# Patient Record
Sex: Female | Born: 1995 | Race: White | Hispanic: No | Marital: Single | Smoking: Never smoker
Health system: Southern US, Community
[De-identification: ages and names within clinical notes are randomized; demographics above are authoritative.]

## PROBLEM LIST (undated history)

## (undated) ENCOUNTER — Inpatient Hospital Stay (HOSPITAL_COMMUNITY): Payer: Self-pay

## (undated) DIAGNOSIS — R51 Headache: Secondary | ICD-10-CM

## (undated) DIAGNOSIS — F329 Major depressive disorder, single episode, unspecified: Secondary | ICD-10-CM

## (undated) DIAGNOSIS — F419 Anxiety disorder, unspecified: Secondary | ICD-10-CM

## (undated) DIAGNOSIS — R519 Headache, unspecified: Secondary | ICD-10-CM

## (undated) DIAGNOSIS — F32A Depression, unspecified: Secondary | ICD-10-CM

## (undated) DIAGNOSIS — D649 Anemia, unspecified: Secondary | ICD-10-CM

---
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
## 2014-04-08 ENCOUNTER — Encounter (HOSPITAL_COMMUNITY): Payer: Self-pay | Admitting: *Deleted

## 2014-04-08 ENCOUNTER — Inpatient Hospital Stay (HOSPITAL_COMMUNITY): Payer: Medicaid Other

## 2014-04-08 ENCOUNTER — Inpatient Hospital Stay (HOSPITAL_COMMUNITY)
Admission: AD | Admit: 2014-04-08 | Discharge: 2014-04-08 | Disposition: A | Discharge status: Home / Self Care | Payer: Medicaid Other | Source: Ambulatory Visit | Attending: Obstetrics & Gynecology | Admitting: Obstetrics & Gynecology

## 2014-04-08 DIAGNOSIS — O418X10 Other specified disorders of amniotic fluid and membranes, first trimester, not applicable or unspecified: Secondary | ICD-10-CM

## 2014-04-08 DIAGNOSIS — Z3A01 Less than 8 weeks gestation of pregnancy: Secondary | ICD-10-CM | POA: Diagnosis not present

## 2014-04-08 DIAGNOSIS — O200 Threatened abortion: Secondary | ICD-10-CM | POA: Diagnosis not present

## 2014-04-08 DIAGNOSIS — O209 Hemorrhage in early pregnancy, unspecified: Secondary | ICD-10-CM | POA: Insufficient documentation

## 2014-04-08 DIAGNOSIS — O468X1 Other antepartum hemorrhage, first trimester: Secondary | ICD-10-CM

## 2014-04-08 DIAGNOSIS — B373 Candidiasis of vulva and vagina: Secondary | ICD-10-CM | POA: Diagnosis not present

## 2014-04-08 DIAGNOSIS — O98811 Other maternal infectious and parasitic diseases complicating pregnancy, first trimester: Secondary | ICD-10-CM | POA: Insufficient documentation

## 2014-04-08 HISTORY — DX: Anemia, unspecified: D64.9

## 2014-04-08 HISTORY — DX: Headache: R51

## 2014-04-08 HISTORY — DX: Headache, unspecified: R51.9

## 2014-04-08 LAB — CBC
HCT: 39.7 % (ref 36.0–46.0)
Hemoglobin: 13.6 g/dL (ref 12.0–15.0)
MCH: 30.4 pg (ref 26.0–34.0)
MCHC: 34.3 g/dL (ref 30.0–36.0)
MCV: 88.8 fL (ref 78.0–100.0)
Platelets: 251 10*3/uL (ref 150–400)
RBC: 4.47 MIL/uL (ref 3.87–5.11)
RDW: 12.9 % (ref 11.5–15.5)
WBC: 12.2 10*3/uL — ABNORMAL HIGH (ref 4.0–10.5)

## 2014-04-08 LAB — WET PREP, GENITAL
CLUE CELLS WET PREP: NONE SEEN
Trich, Wet Prep: NONE SEEN

## 2014-04-08 LAB — URINE MICROSCOPIC-ADD ON

## 2014-04-08 LAB — URINALYSIS, ROUTINE W REFLEX MICROSCOPIC
BILIRUBIN URINE: NEGATIVE
Glucose, UA: NEGATIVE mg/dL
Ketones, ur: NEGATIVE mg/dL
NITRITE: NEGATIVE
PH: 6 (ref 5.0–8.0)
PROTEIN: NEGATIVE mg/dL
Specific Gravity, Urine: 1.025 (ref 1.005–1.030)
UROBILINOGEN UA: 0.2 mg/dL (ref 0.0–1.0)

## 2014-04-08 LAB — POCT PREGNANCY, URINE: PREG TEST UR: POSITIVE — AB

## 2014-04-08 LAB — HCG, QUANTITATIVE, PREGNANCY: hCG, Beta Chain, Quant, S: 87634 m[IU]/mL — ABNORMAL HIGH (ref <5)

## 2014-04-08 MED ORDER — FLUCONAZOLE 150 MG PO TABS
150.0000 mg | ORAL_TABLET | Freq: Once | ORAL | Status: AC
  Administered 2014-04-08: 150 mg via ORAL
  Filled 2014-04-08: qty 1

## 2014-04-08 NOTE — MAU Provider Note (Signed)
History     CSN: [id number]  Arrival date and time: 04/08/14 1728   First Provider Initiated Contact with Patient 04/08/14 1828      Chief Complaint  Patient presents with  . Vaginal Bleeding   HPI  Ms [name] is a 19 y.o. female G1P0 at Unknown gestation who presents with vaginal bleeding that has been going on for more than 24 hours. She also complains of lower abdominal pain that is worse on the left side.    OB History    Gravida Para Term Preterm AB TAB SAB Ectopic Multiple Living   1               Past Medical History  Diagnosis Date  . Headache   . Anemia     History reviewed. No pertinent past surgical history.  No family history on file.  History  Substance Use Topics  . Smoking status: Never Smoker   . Smokeless tobacco: Not on file  . Alcohol Use: No    Allergies: No Known Allergies  Prescriptions prior to admission  Medication Sig Dispense Refill Last Dose  . acetaminophen (TYLENOL) 325 MG tablet Take 650 mg by mouth every 6 (six) hours as needed for headache.   Past Week at Unknown time  . Prenatal Vit-Fe Fumarate-FA (PRENATAL MULTIVITAMIN) TABS tablet Take 1 tablet by mouth daily at 12 noon.   04/08/2014 at Unknown time   Results for orders placed or performed during the hospital encounter of 04/08/14 (from the past 48 hour(s))  Urinalysis, Routine w reflex microscopic     Status: Abnormal   Collection Time: 04/08/14  5:40 PM  Result Value Ref Range   Color, Urine YELLOW YELLOW   APPearance CLEAR CLEAR   Specific Gravity, Urine 1.025 1.005 - 1.030   pH 6.0 5.0 - 8.0   Glucose, UA NEGATIVE NEGATIVE mg/dL   Hgb urine dipstick LARGE (A) NEGATIVE   Bilirubin Urine NEGATIVE NEGATIVE   Ketones, ur NEGATIVE NEGATIVE mg/dL   Protein, ur NEGATIVE NEGATIVE mg/dL   Urobilinogen, UA 0.2 0.0 - 1.0 mg/dL   Nitrite NEGATIVE NEGATIVE   Leukocytes, UA SMALL (A) NEGATIVE  Urine microscopic-add on     Status: Abnormal   Collection Time: 04/08/14   5:40 PM  Result Value Ref Range   Squamous Epithelial / LPF FEW (A) RARE   WBC, UA 0-2 <3 WBC/hpf   RBC / HPF 3-6 <3 RBC/hpf   Bacteria, UA RARE RARE   Urine-Other YEAST   Pregnancy, urine POC     Status: Abnormal   Collection Time: 04/08/14  5:58 PM  Result Value Ref Range   Preg Test, Ur POSITIVE (A) NEGATIVE    Comment:        THE SENSITIVITY OF THIS METHODOLOGY IS >24 mIU/mL   Wet prep, genital     Status: Abnormal   Collection Time: 04/08/14  6:30 PM  Result Value Ref Range   Yeast Wet Prep HPF POC FEW (A) NONE SEEN   Trich, Wet Prep NONE SEEN NONE SEEN   Clue Cells Wet Prep HPF POC NONE SEEN NONE SEEN   WBC, Wet Prep HPF POC FEW (A) NONE SEEN    Comment: FEW BACTERIA SEEN  CBC     Status: Abnormal   Collection Time: 04/08/14  6:35 PM  Result Value Ref Range   WBC 12.2 (H) 4.0 - 10.5 K/uL   RBC 4.47 3.87 - 5.11 MIL/uL   Hemoglobin 13.6 12.0 - 15.0  g/dL   HCT 96.0 45.4 - 09.8 %   MCV 88.8 78.0 - 100.0 fL   MCH 30.4 26.0 - 34.0 pg   MCHC 34.3 30.0 - 36.0 g/dL   RDW 11.9 14.7 - 82.9 %   Platelets 251 150 - 400 K/uL  ABO/Rh     Status: None (Preliminary result)   Collection Time: 04/08/14  6:35 PM  Result Value Ref Range   ABO/RH(D) O POS   hCG, quantitative, pregnancy     Status: Abnormal   Collection Time: 04/08/14  6:35 PM  Result Value Ref Range   hCG, Beta Chain, Quant, S 56213 (H) <5 mIU/mL    Comment:          GEST. AGE      CONC.  (mIU/mL)   <=1 WEEK        5 - 50     2 WEEKS       50 - 500     3 WEEKS       100 - 10,000     4 WEEKS     1,000 - 30,000     5 WEEKS     3,500 - 115,000   6-8 WEEKS     12,000 - 270,000    12 WEEKS     15,000 - 220,000        FEMALE AND NON-PREGNANT FEMALE:     LESS THAN 5 mIU/mL     Review of Systems  Constitutional: Negative for fever and chills.  Gastrointestinal: Positive for abdominal pain (Bilateral lower abdominal pain worse on the left side. ). Negative for nausea and vomiting.  Genitourinary: Negative for  dysuria, urgency and frequency.       Vaginal bleeding; bright red    Physical Exam   Blood pressure 129/75, pulse 119, temperature 98.5 F (36.9 C), resp. rate 18, height  (1.626 m), weight 53.978 kg (119 lb), last menstrual period 02/03/2014.  Physical Exam  Constitutional: She is oriented to person, place, and time. She appears well-developed and well-nourished. No distress.  HENT:  Head: Normocephalic.  Eyes: Pupils are equal, round, and reactive to light.  Neck: Neck supple.  Respiratory: Effort normal and breath sounds normal. No respiratory distress.  GI: Soft. She exhibits no distension. There is no tenderness. There is no rebound.  Genitourinary:  Speculum deferred Wet prep and GC collected without speculum by NP. Brown, vaginal bleeding noted on swabs.  Bimanual exam: Cervix closed Uterus non tender, normal size Adnexa non tender, no masses bilaterally GC/Chlam, wet prep done Chaperone present for exam.  Musculoskeletal: Normal range of motion.  Neurological: She is alert and oriented to person, place, and time.  Skin: Skin is warm. She is not diaphoretic.  Psychiatric: Her behavior is normal.    MAU Course  Procedures None   MDM + yeast on UA; diflucan ordered   CBC ABO CBC Quant  [place] Wet pep GC HIV   Report given to K. Teague-Clark PAC who resumes care of the patient; patient in [place].   O positive blood type   Assessment and Plan   A:  Yeast vaginitis Vaginal bleeding in pregnancy; first trimester   Duane Lope, NP 04/08/2014 6:38 PM  P: Discharge to home Diflucan for yeast given in MAU.  Encourage good hydration Pelvic Rest Follow up for [facility] asap Patient may return to MAU as needed or if her condition were to change or worsen

## 2014-04-08 NOTE — MAU Note (Signed)
Pt presents to MAU with complaints of vaginal bleeding that started 2 hours ago. Reports pain in her left side.

## 2014-04-08 NOTE — Discharge Instructions (Signed)
Subchorionic Hematoma A subchorionic hematoma is a gathering of blood between the outer wall of the placenta and the inner wall of the womb (uterus). The placenta is the organ that connects the fetus to the wall of the uterus. The placenta performs the feeding, breathing (oxygen to the fetus), and waste removal (excretory work) of the fetus.  Subchorionic hematoma is the most common abnormality found on a result from ultrasonography done during the first trimester or early second trimester of pregnancy. If there has been little or no vaginal bleeding, early small hematomas usually shrink on their own and do not affect your baby or pregnancy. The blood is gradually absorbed over 1-2 weeks. When bleeding starts later in pregnancy or the hematoma is larger or occurs in an older pregnant woman, the outcome may not be as good. Larger hematomas may get bigger, which increases the chances for miscarriage. Subchorionic hematoma also increases the risk of premature detachment of the placenta from the uterus, preterm (premature) labor, and stillbirth. HOME CARE INSTRUCTIONS  Stay on bed rest if your health care provider recommends this. Although bed rest will not prevent more bleeding or prevent a miscarriage, your health care provider may recommend bed rest until you are advised otherwise.  Avoid heavy lifting (more than 10 lb [4.5 kg]), exercise, sexual intercourse, or douching as directed by your health care provider.  Keep track of the number of pads you use each day and how soaked (saturated) they are. Write down this information.  Do not use tampons.  Keep all follow-up appointments as directed by your health care provider. Your health care provider may ask you to have follow-up blood tests or ultrasound tests or both. SEEK IMMEDIATE MEDICAL CARE IF:  You have severe cramps in your stomach, back, abdomen, or pelvis.  You have a fever.  You pass large clots or tissue. Save any tissue for your health  care provider to look at.  Your bleeding increases or you become lightheaded, feel weak, or have fainting episodes. Document Released: 04/23/2006 Document Revised: 05/23/2013 Document Reviewed: 08/05/2012 [facility] Patient Information 2015 [place], [place]. This information is not intended to replace advice given to you by your health care provider. Make sure you discuss any questions you have with your health care provider.  Threatened Miscarriage A threatened miscarriage occurs when you have vaginal bleeding during your first 20 weeks of pregnancy but the pregnancy has not ended. If you have vaginal bleeding during this time, your health care provider will do tests to make sure you are still pregnant. If the tests show you are still pregnant and the developing baby (fetus) inside your womb (uterus) is still growing, your condition is considered a threatened miscarriage. A threatened miscarriage does not mean your pregnancy will end, but it does increase the risk of losing your pregnancy (complete miscarriage). CAUSES  The cause of a threatened miscarriage is usually not known. If you go on to have a complete miscarriage, the most common cause is an abnormal number of chromosomes in the developing baby. Chromosomes are the structures inside cells that hold all your genetic material. Some causes of vaginal bleeding that do not result in miscarriage include:  Having sex.  Having an infection.  Normal hormone changes of pregnancy.  Bleeding that occurs when an egg implants in your uterus. RISK FACTORS Risk factors for bleeding in early pregnancy include:  Obesity.  Smoking.  Drinking excessive amounts of alcohol or caffeine.  Recreational drug use. SIGNS AND SYMPTOMS  Light vaginal  bleeding.  Mild abdominal pain or cramps. DIAGNOSIS  If you have bleeding with or without abdominal pain before 20 weeks of pregnancy, your health care provider will do tests to check whether you are still  pregnant. One important test involves using sound waves and a computer (ultrasound) to create images of the inside of your uterus. Other tests include an internal exam of your vagina and uterus (pelvic exam) and measurement of your baby's heart rate.  You may be diagnosed with a threatened miscarriage if:  Ultrasound testing shows you are still pregnant.  Your baby's heart rate is strong.  A pelvic exam shows that the opening between your uterus and your vagina (cervix) is closed.  Your heart rate and blood pressure are stable.  Blood tests confirm you are still pregnant. TREATMENT  No treatments have been shown to prevent a threatened miscarriage from going on to a complete miscarriage. However, the right home care is important.  HOME CARE INSTRUCTIONS   Make sure you keep all your appointments for prenatal care. This is very important.  Get plenty of rest.  Do not have sex or use tampons if you have vaginal bleeding.  Do not douche.  Do not smoke or use recreational drugs.  Do not drink alcohol.  Avoid caffeine. SEEK MEDICAL CARE IF:  You have light vaginal bleeding or spotting while pregnant.  You have abdominal pain or cramping.  You have a fever. SEEK IMMEDIATE MEDICAL CARE IF:  You have heavy vaginal bleeding.  You have blood clots coming from your vagina.  You have severe low back pain or abdominal cramps.  You have fever, chills, and severe abdominal pain. MAKE SURE YOU:  Understand these instructions.  Will watch your condition.  Will get help right away if you are not doing well or get worse. Document Released: 01/06/2005 Document Revised: 01/11/2013 Document Reviewed: 11/02/2012 [facility] Patient Information 2015 [place]. This information is not intended to replace advice given to you by your health care provider. Make sure you discuss any questions you have with your health care provider.  Pelvic Rest Pelvic rest is sometimes recommended for  women when:   The placenta is partially or completely covering the opening of the cervix (placenta previa).  There is bleeding between the uterine wall and the amniotic sac in the first trimester (subchorionic hemorrhage).  The cervix begins to open without labor starting (incompetent cervix, cervical insufficiency).  The labor is too early (preterm labor). HOME CARE INSTRUCTIONS  Do not have sexual intercourse, stimulation, or an orgasm.  Do not use tampons, douche, or put anything in the vagina.  Do not lift anything over 10 pounds (4.5 kg).  Avoid strenuous activity or straining your pelvic muscles. SEEK MEDICAL CARE IF:  You have any vaginal bleeding during pregnancy. Treat this as a potential emergency.  You have cramping pain felt low in the stomach (stronger than menstrual cramps).  You notice vaginal discharge (watery, mucus, or bloody).  You have a low, dull backache.  There are regular contractions or uterine tightening. SEEK IMMEDIATE MEDICAL CARE IF: You have vaginal bleeding and have placenta previa.  Document Released: 05/03/2010 Document Revised: 03/31/2011 Document Reviewed: 05/03/2010 [facility] Patient Information 2015 [place]. This information is not intended to replace advice given to you by your health care provider. Make sure you discuss any questions you have with your health care provider.

## 2014-04-09 LAB — HIV ANTIBODY (ROUTINE TESTING W REFLEX): HIV Screen 4th Generation wRfx: NONREACTIVE

## 2014-04-09 LAB — ABO/RH: ABO/RH(D): O POS

## 2014-04-10 LAB — OB RESULTS CONSOLE HEPATITIS B SURFACE ANTIGEN: Hepatitis B Surface Ag: NEGATIVE

## 2014-04-10 LAB — GC/CHLAMYDIA PROBE AMP (~~LOC~~) NOT AT ARMC
CHLAMYDIA, DNA PROBE: NEGATIVE
Neisseria Gonorrhea: NEGATIVE

## 2014-04-10 LAB — OB RESULTS CONSOLE RPR: RPR: NONREACTIVE

## 2014-04-10 LAB — OB RESULTS CONSOLE RUBELLA ANTIBODY, IGM: Rubella: IMMUNE

## 2014-04-10 LAB — OB RESULTS CONSOLE ABO/RH: RH Type: POSITIVE

## 2014-04-10 LAB — OB RESULTS CONSOLE GC/CHLAMYDIA
CHLAMYDIA, DNA PROBE: NEGATIVE
GC PROBE AMP, GENITAL: NEGATIVE

## 2014-04-10 LAB — OB RESULTS CONSOLE ANTIBODY SCREEN: ANTIBODY SCREEN: NEGATIVE

## 2014-04-10 LAB — OB RESULTS CONSOLE HIV ANTIBODY (ROUTINE TESTING): HIV: NONREACTIVE

## 2014-05-10 ENCOUNTER — Encounter (HOSPITAL_COMMUNITY): Payer: Self-pay

## 2014-05-10 ENCOUNTER — Inpatient Hospital Stay (HOSPITAL_COMMUNITY)
Admission: AD | Admit: 2014-05-10 | Discharge: 2014-05-10 | Disposition: A | Discharge status: Home / Self Care | Payer: Medicaid Other | Source: Ambulatory Visit | Attending: Obstetrics and Gynecology | Admitting: Obstetrics and Gynecology

## 2014-05-10 DIAGNOSIS — G43009 Migraine without aura, not intractable, without status migrainosus: Secondary | ICD-10-CM | POA: Diagnosis not present

## 2014-05-10 DIAGNOSIS — O9989 Other specified diseases and conditions complicating pregnancy, childbirth and the puerperium: Secondary | ICD-10-CM | POA: Diagnosis not present

## 2014-05-10 DIAGNOSIS — Z3A11 11 weeks gestation of pregnancy: Secondary | ICD-10-CM | POA: Diagnosis not present

## 2014-05-10 DIAGNOSIS — R509 Fever, unspecified: Secondary | ICD-10-CM | POA: Diagnosis present

## 2014-05-10 LAB — COMPREHENSIVE METABOLIC PANEL
ALBUMIN: 3.4 g/dL — AB (ref 3.5–5.2)
ALK PHOS: 52 U/L (ref 39–117)
ALT: 22 U/L (ref 0–35)
AST: 34 U/L (ref 0–37)
Anion gap: 7 (ref 5–15)
BILIRUBIN TOTAL: 0.3 mg/dL (ref 0.3–1.2)
BUN: 9 mg/dL (ref 6–23)
CO2: 23 mmol/L (ref 19–32)
Calcium: 8.1 mg/dL — ABNORMAL LOW (ref 8.4–10.5)
Chloride: 101 mmol/L (ref 96–112)
Creatinine, Ser: 0.56 mg/dL (ref 0.50–1.10)
GFR calc Af Amer: >90 mL/min (ref >90)
GFR calc non Af Amer: >90 mL/min (ref >90)
GLUCOSE: 86 mg/dL (ref 70–99)
POTASSIUM: 3.6 mmol/L (ref 3.5–5.1)
Sodium: 131 mmol/L — ABNORMAL LOW (ref 135–145)
Total Protein: 6.7 g/dL (ref 6.0–8.3)

## 2014-05-10 LAB — CBC WITH DIFFERENTIAL/PLATELET
BASOS ABS: 0 10*3/uL (ref 0.0–0.1)
Basophils Relative: 0 % (ref 0–1)
EOS PCT: 0 % (ref 0–5)
Eosinophils Absolute: 0 10*3/uL (ref 0.0–0.7)
HCT: 36 % (ref 36.0–46.0)
Hemoglobin: 12.3 g/dL (ref 12.0–15.0)
LYMPHS PCT: 8 % — AB (ref 12–46)
Lymphs Abs: 0.4 10*3/uL — ABNORMAL LOW (ref 0.7–4.0)
MCH: 30.2 pg (ref 26.0–34.0)
MCHC: 34.2 g/dL (ref 30.0–36.0)
MCV: 88.5 fL (ref 78.0–100.0)
Monocytes Absolute: 0.7 10*3/uL (ref 0.1–1.0)
Monocytes Relative: 14 % — ABNORMAL HIGH (ref 3–12)
NEUTROS ABS: 3.9 10*3/uL (ref 1.7–7.7)
Neutrophils Relative %: 78 % — ABNORMAL HIGH (ref 43–77)
PLATELETS: 121 10*3/uL — AB (ref 150–400)
RBC: 4.07 MIL/uL (ref 3.87–5.11)
RDW: 13.6 % (ref 11.5–15.5)
WBC: 5 10*3/uL (ref 4.0–10.5)

## 2014-05-10 LAB — URINALYSIS, ROUTINE W REFLEX MICROSCOPIC
Bilirubin Urine: NEGATIVE
GLUCOSE, UA: NEGATIVE mg/dL
Hgb urine dipstick: NEGATIVE
Ketones, ur: 15 mg/dL — AB
LEUKOCYTES UA: NEGATIVE
NITRITE: NEGATIVE
PH: 5.5 (ref 5.0–8.0)
PROTEIN: NEGATIVE mg/dL
Specific Gravity, Urine: >1.03 — ABNORMAL HIGH (ref 1.005–1.030)
Urobilinogen, UA: 0.2 mg/dL (ref 0.0–1.0)

## 2014-05-10 MED ORDER — DEXAMETHASONE SODIUM PHOSPHATE 10 MG/ML IJ SOLN
10.0000 mg | Freq: Once | INTRAMUSCULAR | Status: AC
  Administered 2014-05-10: 10 mg via INTRAVENOUS
  Filled 2014-05-10: qty 1

## 2014-05-10 MED ORDER — METOCLOPRAMIDE HCL 5 MG/ML IJ SOLN
10.0000 mg | Freq: Once | INTRAMUSCULAR | Status: AC
  Administered 2014-05-10: 10 mg via INTRAVENOUS
  Filled 2014-05-10: qty 2

## 2014-05-10 MED ORDER — DIPHENHYDRAMINE HCL 50 MG/ML IJ SOLN
12.5000 mg | Freq: Once | INTRAMUSCULAR | Status: AC
  Administered 2014-05-10: 12.5 mg via INTRAVENOUS
  Filled 2014-05-10: qty 1

## 2014-05-10 MED ORDER — LACTATED RINGERS IV BOLUS (SEPSIS)
1000.0000 mL | Freq: Once | INTRAVENOUS | Status: AC
  Administered 2014-05-10: 1000 mL via INTRAVENOUS

## 2014-05-10 NOTE — Discharge Instructions (Signed)

## 2014-05-10 NOTE — MAU Note (Signed)
Patient c/o waking up this morning with fever of 103, she has heard this can cause miscarriages. Has a non productive cough.

## 2014-05-10 NOTE — MAU Provider Note (Signed)
History     CSN: [id number]  Arrival date and time: 05/10/14 1058   First Provider Initiated Contact with Patient 05/10/14 1220      Chief Complaint  Patient presents with  . Fever  . Cough   HPI   [name] is a 19 y.o. female G1P0 at 82w4dwho presents with fever. She checked her temperature at 0700 and it was 103 orally. She checked her temperature because she was feeling bad; chills, ha, and cough. She has had these symptoms since yesterday.     She did not take any medication for the fever and she denies cough currently.   The headache started Monday; she has a history of migraines. She took tylenol yesterday 650 mg, without relief. The headache comes and goes. She has never tried any prescription medications other than tylenol. + light sensitivity. Negative nausea currently. She rates her HA pain 10/10.   OB History    Gravida Para Term Preterm AB TAB SAB Ectopic Multiple Living   1               Past Medical History  Diagnosis Date  . Headache   . Anemia     History reviewed. No pertinent past surgical history.  History reviewed. No pertinent family history.  History  Substance Use Topics  . Smoking status: Never Smoker   . Smokeless tobacco: Not on file  . Alcohol Use: No    Allergies: No Known Allergies  Prescriptions prior to admission  Medication Sig Dispense Refill Last Dose  . acetaminophen (TYLENOL) 325 MG tablet Take 650 mg by mouth every 6 (six) hours as needed for headache.   05/09/2014 at Unknown time  . Prenatal Vit-Fe Fumarate-FA (PRENATAL MULTIVITAMIN) TABS tablet Take 1 tablet by mouth daily at 12 noon.   05/09/2014 at Unknown time   Results for orders placed or performed during the hospital encounter of 05/10/14 (from the past 48 hour(s))  Urinalysis, Routine w reflex microscopic     Status: Abnormal   Collection Time: 05/10/14 11:05 AM  Result Value Ref Range   Color, Urine YELLOW YELLOW   APPearance CLEAR CLEAR   Specific  Gravity, Urine >1.030 (H) 1.005 - 1.030   pH 5.5 5.0 - 8.0   Glucose, UA NEGATIVE NEGATIVE mg/dL   Hgb urine dipstick NEGATIVE NEGATIVE   Bilirubin Urine NEGATIVE NEGATIVE   Ketones, ur 15 (A) NEGATIVE mg/dL   Protein, ur NEGATIVE NEGATIVE mg/dL   Urobilinogen, UA 0.2 0.0 - 1.0 mg/dL   Nitrite NEGATIVE NEGATIVE   Leukocytes, UA NEGATIVE NEGATIVE    Comment: MICROSCOPIC NOT DONE ON URINES WITH NEGATIVE PROTEIN, BLOOD, LEUKOCYTES, NITRITE, OR GLUCOSE <1000 mg/dL.  CBC with Differential     Status: Abnormal   Collection Time: 05/10/14 11:55 AM  Result Value Ref Range   WBC 5.0 4.0 - 10.5 K/uL   RBC 4.07 3.87 - 5.11 MIL/uL   Hemoglobin 12.3 12.0 - 15.0 g/dL   HCT 36.0 36.0 - 46.0 %   MCV 88.5 78.0 - 100.0 fL   MCH 30.2 26.0 - 34.0 pg   MCHC 34.2 30.0 - 36.0 g/dL   RDW 13.6 11.5 - 15.5 %   Platelets 121 (L) 150 - 400 K/uL   Neutrophils Relative % 78 (H) 43 - 77 %   Neutro Abs 3.9 1.7 - 7.7 K/uL   Lymphocytes Relative 8 (L) 12 - 46 %   Lymphs Abs 0.4 (L) 0.7 - 4.0 K/uL  Monocytes Relative 14 (H) 3 - 12 %   Monocytes Absolute 0.7 0.1 - 1.0 K/uL   Eosinophils Relative 0 0 - 5 %   Eosinophils Absolute 0.0 0.0 - 0.7 K/uL   Basophils Relative 0 0 - 1 %   Basophils Absolute 0.0 0.0 - 0.1 K/uL  Comprehensive metabolic panel     Status: Abnormal   Collection Time: 05/10/14 11:55 AM  Result Value Ref Range   Sodium 131 (L) 135 - 145 mmol/L   Potassium 3.6 3.5 - 5.1 mmol/L   Chloride 101 96 - 112 mmol/L   CO2 23 19 - 32 mmol/L   Glucose, Bld 86 70 - 99 mg/dL   BUN 9 6 - 23 mg/dL   Creatinine, Ser 0.56 0.50 - 1.10 mg/dL   Calcium 8.1 (L) 8.4 - 10.5 mg/dL   Total Protein 6.7 6.0 - 8.3 g/dL   Albumin 3.4 (L) 3.5 - 5.2 g/dL   AST 34 0 - 37 U/L   ALT 22 0 - 35 U/L   Alkaline Phosphatase 52 39 - 117 U/L   Total Bilirubin 0.3 0.3 - 1.2 mg/dL   GFR calc non Af Amer >90 >90 mL/min   GFR calc Af Amer >90 >90 mL/min    Comment: (NOTE) The eGFR has been calculated using the CKD EPI  equation. This calculation has not been validated in all clinical situations. eGFR's persistently <90 mL/min signify possible Chronic Kidney Disease.    Anion gap 7 5 - 15    Review of Systems  Constitutional: Positive for fever and chills.  Respiratory: Positive for cough. Negative for sputum production.   Gastrointestinal: Negative for abdominal pain, diarrhea and constipation.  Genitourinary:       Denies vaginal bleeding   Neurological: Positive for headaches.   Physical Exam   Blood pressure 116/71, pulse 104, temperature 99.5 F (37.5 C), temperature source Oral, resp. rate 16, height _0  (1.626 m), weight 55.702 kg (122 lb 12.8 oz), last menstrual period 02/03/2014, SpO2 100 %.  Physical Exam  Constitutional: She is oriented to person, place, and time. She appears well-developed and well-nourished.  Non-toxic appearance. She does not have a sickly appearance. She does not appear ill. No distress.  HENT:  Head: Normocephalic.  Eyes: Pupils are equal, round, and reactive to light.  Neck: Neck supple.  Respiratory: Effort normal. No respiratory distress. She has no wheezes.  Musculoskeletal: Normal range of motion.  Neurological: She is alert and oriented to person, place, and time. GCS eye subscore is 4. GCS verbal subscore is 5. GCS motor subscore is 6.  Skin: Skin is warm. She is not diaphoretic.  Psychiatric: Her behavior is normal.    MAU Course  Procedures  None  MDM  UA CBC CMET + fetal heart tones by doppler   Decadron, benadryl, reglan in LR bolus 1 liter Patient rates her HA pain 1/10 following bolus and medication.   Assessment and Plan   A:  1. Migraine without aura and without status migrainosus, not intractable    P:  Discharge home in stable condition Keep scheduled appointment with OB dr.  Return to MAU if symptoms worsen Small, frequent meals Increase fluid intake   Lezlie Lye, NP 05/10/2014 12:26 PM

## 2014-05-15 ENCOUNTER — Ambulatory Visit (INDEPENDENT_AMBULATORY_CARE_PROVIDER_SITE_OTHER): Payer: Medicaid Other | Admitting: Neurology

## 2014-05-15 ENCOUNTER — Encounter: Payer: Self-pay | Admitting: Neurology

## 2014-05-15 VITALS — BP 98/66 | HR 60 | Resp 18 | Ht 64.0 in | Wt 124.0 lb

## 2014-05-15 DIAGNOSIS — G43709 Chronic migraine without aura, not intractable, without status migrainosus: Secondary | ICD-10-CM | POA: Diagnosis not present

## 2014-05-15 DIAGNOSIS — Z3483 Encounter for supervision of other normal pregnancy, third trimester: Secondary | ICD-10-CM

## 2014-05-15 DIAGNOSIS — Z3493 Encounter for supervision of normal pregnancy, unspecified, third trimester: Secondary | ICD-10-CM

## 2014-05-15 NOTE — Progress Notes (Signed)
NEUROLOGY CONSULTATION NOTE  SAVANNHA WELLE MRN: [id number] DOB: 01-29-1995  Referring provider: Dr. Ambrose Mantle (her OBGYN) Primary care provider: none  Reason for consult:  Migraine.  HISTORY OF PRESENT ILLNESS: [name] is a 19 year old right-handed female who is 12 weeks into her first pregnancy who presents for migraine.  Records and labs reviewed.  Onset:  Since 19 years old, but worse since pregnancy.  These are her typical migraines except they are daily. Location:  Bi-frontal Quality:  Non-throbbing, squeezing Intensity:  8/10 Aura:  no Prodrome:  no Associated symptoms:  Photophobia and phonophobia.  No nausea or visual disturbance. Duration:  Ongoing until she goes to sleep Frequency:  Prior to pregnancy, they occurred once every 2 weeks.  Since pregnancy, they are daily Triggers/exacerbating factors:  no Relieving factors:  Laying down in the dark Activity:  Able to force self to function.  Past abortive therapy:  Prior to pregnancy:  Excedrin migraine (takes edge off), muscle relaxant (ineffective) Past preventative therapy:  none  Current abortive therapy:  Tylenol (ineffective) Current preventative therapy:  None Other medication:  Pre-natal vitamin  Recent labs include overall unremarkable CBC with platelet mildly low of 121, overall unremarkable CMP and non-reactive HIV titer.  Caffeine:  no Alcohol:  no Smoker:  no Diet:  Good.  Drinks juice and water Exercise:  walks Depression/stress:  no Sleep hygiene:  Hard to sleep during pregnancy.  Notes hip pain since pregnancy. Family history of headache:  Sister has headaches.  No family history of aneurysm.  PAST MEDICAL HISTORY: Past Medical History  Diagnosis Date  . Headache   . Anemia   . Anemia     PAST SURGICAL HISTORY: No past surgical history on file.  MEDICATIONS: Current Outpatient Prescriptions on File Prior to Visit  Medication Sig Dispense Refill  . acetaminophen (TYLENOL)  325 MG tablet Take 650 mg by mouth every 6 (six) hours as needed for headache.    . Prenatal Vit-Fe Fumarate-FA (PRENATAL MULTIVITAMIN) TABS tablet Take 1 tablet by mouth daily at 12 noon.     No current facility-administered medications on file prior to visit.    ALLERGIES: No Known Allergies  FAMILY HISTORY: Family History  Problem Relation Age of Onset  . Hyperlipidemia Father   . Gout Father     SOCIAL HISTORY: History   Social History  . Marital Status: Single    Spouse Name: N/A  . Number of Children: N/A  . Years of Education: N/A   Occupational History  . Not on file.   Social History Main Topics  . Smoking status: Never Smoker   . Smokeless tobacco: Never Used  . Alcohol Use: No  . Drug Use: No  . Sexual Activity:    Partners: Male     Comment: 2 weeks ago   Other Topics Concern  . Not on file   Social History Narrative    REVIEW OF SYSTEMS: Constitutional: No fevers, chills, or sweats, no generalized fatigue, change in appetite Eyes: No visual changes, double vision, eye pain Ear, nose and throat: No hearing loss, ear pain, nasal congestion, sore throat Cardiovascular: No chest pain, palpitations Respiratory:  No shortness of breath at rest or with exertion, wheezes GastrointestinaI: No nausea, vomiting, diarrhea, abdominal pain, fecal incontinence Genitourinary:  No dysuria, urinary retention or frequency Musculoskeletal:  Hip pain Integumentary: No rash, pruritus, skin lesions Neurological: as above Psychiatric: Some problems with sleep Endocrine: No palpitations, fatigue, diaphoresis, mood swings, change in appetite,  change in weight, increased thirst Hematologic/Lymphatic:  No anemia, purpura, petechiae. Allergic/Immunologic: no itchy/runny eyes, nasal congestion, recent allergic reactions, rashes  PHYSICAL EXAM: Filed Vitals:   05/15/14 0859  BP: 98/66  Pulse: 60  Resp: 18   General: No acute distress Head:   Normocephalic/atraumatic Eyes:  fundi unremarkable, without vessel changes, exudates, hemorrhages or papilledema. Neck: supple, no paraspinal tenderness, full range of motion Back: No paraspinal tenderness Heart: regular rate and rhythm Lungs: Clear to auscultation bilaterally. Vascular: No carotid bruits. Neurological Exam: Mental status: alert and oriented to person, place, and time, recent and remote memory intact, fund of knowledge intact, attention and concentration intact, speech fluent and not dysarthric, language intact. Cranial nerves: CN I: not tested CN II: pupils equal, round and reactive to light, visual fields intact, fundi unremarkable, without vessel changes, exudates, hemorrhages or papilledema. CN III, IV, VI:  full range of motion, no nystagmus, no ptosis CN V: facial sensation intact CN VII: upper and lower face symmetric CN VIII: hearing intact CN IX, X: gag intact, uvula midline CN XI: sternocleidomastoid and trapezius muscles intact CN XII: tongue midline Bulk & Tone: normal, no fasciculations. Motor:  5/5 throughout Sensation:  Temperature and vibration intact Deep Tendon Reflexes:  2+ throughout, toes downgoing Finger to nose testing:  No dysmetria Heel to shin:  No dysmetria Gait:  Antalgic gait due to hip pain.  Able to turn and walk in tandem. Romberg negative.  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  Treatment options are limited.  Since these are her typical migraines and her exam is unremarkable, I don't think imaging of the brain is needed.  PLAN: 1.  Will try magnesium 200mg  daily.  She will follow up in 6 weeks.  Other option may be cycloheptadine, which is category B. 2.  I would stop Tylenol, since it is ineffective.  Unfortunately, there really aren't any other options for abortive therapy.  45 minutes spent with patient, over 50% spent discussing management.  Thank you for allowing me to take  part in the care of this patient.  Shon MilletAdam Jaffe, DO  CC:  Tracey Harrieshomas Henley, MD

## 2014-05-15 NOTE — Patient Instructions (Signed)
1.  Start taking magnesium 200mg  daily.  You can get it over the counter.   2.  Follow up in 6 weeks 3.  Stop Tylenol since it is ineffective.

## 2014-05-22 ENCOUNTER — Ambulatory Visit: Payer: Medicaid Other | Admitting: Physical Therapy

## 2014-06-14 ENCOUNTER — Ambulatory Visit: Payer: Medicaid Other | Admitting: Neurology

## 2014-07-21 ENCOUNTER — Ambulatory Visit: Payer: Medicaid Other | Admitting: Neurology

## 2014-07-24 ENCOUNTER — Encounter (HOSPITAL_COMMUNITY): Payer: Self-pay | Admitting: *Deleted

## 2014-07-24 ENCOUNTER — Inpatient Hospital Stay (HOSPITAL_COMMUNITY)
Admission: AD | Admit: 2014-07-24 | Discharge: 2014-07-24 | Disposition: A | Discharge status: Home / Self Care | Payer: Medicaid Other | Source: Ambulatory Visit | Attending: Obstetrics and Gynecology | Admitting: Obstetrics and Gynecology

## 2014-07-24 DIAGNOSIS — R109 Unspecified abdominal pain: Secondary | ICD-10-CM | POA: Diagnosis not present

## 2014-07-24 DIAGNOSIS — E860 Dehydration: Secondary | ICD-10-CM | POA: Insufficient documentation

## 2014-07-24 DIAGNOSIS — Z3A22 22 weeks gestation of pregnancy: Secondary | ICD-10-CM | POA: Insufficient documentation

## 2014-07-24 DIAGNOSIS — O9989 Other specified diseases and conditions complicating pregnancy, childbirth and the puerperium: Secondary | ICD-10-CM | POA: Diagnosis not present

## 2014-07-24 DIAGNOSIS — O26899 Other specified pregnancy related conditions, unspecified trimester: Secondary | ICD-10-CM

## 2014-07-24 LAB — URINALYSIS, ROUTINE W REFLEX MICROSCOPIC
Bilirubin Urine: NEGATIVE
Glucose, UA: 500 mg/dL — AB
Hgb urine dipstick: NEGATIVE
KETONES UR: NEGATIVE mg/dL
LEUKOCYTES UA: NEGATIVE
Nitrite: NEGATIVE
PH: 5.5 (ref 5.0–8.0)
Protein, ur: NEGATIVE mg/dL
Urobilinogen, UA: 0.2 mg/dL (ref 0.0–1.0)

## 2014-07-24 NOTE — Discharge Instructions (Signed)
Drink at least 8 8-oz glasses of water every day. See your doctor if your symptoms are bothersome or worsen. See your doctor if you have leaking or vaginal bleeding. Keep all your scheduled appointments.

## 2014-07-24 NOTE — MAU Provider Note (Signed)
History     CSN: [id number]  Arrival date and time: 07/24/14 1212   First Provider Initiated Contact with Patient 07/24/14 1245      No chief complaint on file.  HPI [name] 19 y.o. [redacted]w[redacted]d  Comes to MAU with right sided abdominal pain.  Had some last night before bed and then had the pain still on awakening this morning.  Has soreness all along the right side and from time to time it is worse.  Has eaten today.  No nausea.  No vomiting.  No diarrhea.  No vaginal bleeding.  No leaking.  Is feeling fetal movement.  Called the office but the answering machine did not have a number to call for the on call MD, so she came in for evaluation.  OB History    Gravida Para Term Preterm AB TAB SAB Ectopic Multiple Living   1               Past Medical History  Diagnosis Date  . Headache   . Anemia   . Anemia     History reviewed. No pertinent past surgical history.  Family History  Problem Relation Age of Onset  . Hyperlipidemia Father   . Gout Father     History  Substance Use Topics  . Smoking status: Never Smoker   . Smokeless tobacco: Never Used  . Alcohol Use: No    Allergies: No Known Allergies  Prescriptions prior to admission  Medication Sig Dispense Refill Last Dose  . acetaminophen (TYLENOL) 325 MG tablet Take 650 mg by mouth every 6 (six) hours as needed for headache.   07/24/2014 at 11:00  . Prenatal Vit-Fe Fumarate-FA (PRENATAL MULTIVITAMIN) TABS tablet Take 1 tablet by mouth daily at 12 noon.   07/24/2014 at 11:00    Review of Systems  Constitutional: Negative for fever.  Gastrointestinal: Positive for abdominal pain. Negative for nausea, vomiting, diarrhea and constipation.  Genitourinary:       No vaginal discharge. No vaginal bleeding. No dysuria.   Physical Exam   Blood pressure 116/64, pulse 89, temperature 97.7 F (36.5 C), temperature source Oral, resp. rate 16, height  (1.626 m), weight 138 lb 6 oz (62.766 kg), last menstrual period  02/03/2014.  Physical Exam  Nursing note and vitals reviewed. Constitutional: She is oriented to person, place, and time. She appears well-developed and well-nourished.  HENT:  Head: Normocephalic.  Eyes: EOM are normal.  Neck: Neck supple.  GI: Soft. There is tenderness. There is no rebound and no guarding.  FHT done by RN with doppler.  Genitourinary:  Speculum exam: Vulva - no lesions Vagina - Small amount of creamy discharge, no odor Cervix - No contact bleeding, closed to visual inspection Bimanual exam: Cervix closed and thick Uterus non tender, gravid Adnexa non tender, no masses bilaterally Chaperone present for exam.  Musculoskeletal: Normal range of motion.  Neurological: She is alert and oriented to person, place, and time.  Skin: Skin is warm and dry.  Psychiatric: She has a normal mood and affect.    MAU Course  Procedures Results for orders placed or performed during the hospital encounter of 07/24/14 (from the past 24 hour(s))  Urinalysis, Routine w reflex microscopic (not at [facility])     Status: Abnormal   Collection Time: 07/24/14 12:22 PM  Result Value Ref Range   Color, Urine YELLOW YELLOW   APPearance CLEAR CLEAR   Specific Gravity, Urine >1.030 (H) 1.005 - 1.030   pH  5.5 5.0 - 8.0   Glucose, UA 500 (A) NEGATIVE mg/dL   Hgb urine dipstick NEGATIVE NEGATIVE   Bilirubin Urine NEGATIVE NEGATIVE   Ketones, ur NEGATIVE NEGATIVE mg/dL   Protein, ur NEGATIVE NEGATIVE mg/dL   Urobilinogen, UA 0.2 0.0 - 1.0 mg/dL   Nitrite NEGATIVE NEGATIVE   Leukocytes, UA NEGATIVE NEGATIVE    MDM Advised client we could do IV fluids to rehydrate - client declines IVF and states she will rehydrate with oral fluids.  Advised mostly water, not juice for rehydration. On exam, uterus felt tight and then softened.  Applied toco but no contractions noted on strip.  Large container of water given and advised to drink all of it.  Assessment and Plan  Abdominal pain in pregnancy at  22 weeks Mild dehydration  Plan Drink at least 8 8-oz glasses of water every day. See your doctor if your symptoms are bothersome or worsen. See your doctor if you have leaking or vaginal bleeding. Keep all your scheduled appointments.   Suly Vukelich 07/24/2014, 12:54 PM

## 2014-07-24 NOTE — MAU Note (Signed)
Pt. Here to be evaluated for sharp abdominal pain located in the right lower side of her abdomen that began last night around 11pm. Denies any trauma to abdomen. Denies any strenuous activity. Denies LOF or bleeding. Last intercourse 2-3 days ago. Next appointment is scheduled for the 14th of July with OB.

## 2014-07-24 NOTE — MAU Note (Signed)
Pt. Denies any pain while sitting. Pt. States pain in abdomen is mostly when walking when she notices it except for last night when she felt the pain that woke her up.

## 2014-11-10 LAB — OB RESULTS CONSOLE GBS: GBS: NEGATIVE

## 2014-11-16 ENCOUNTER — Encounter (HOSPITAL_COMMUNITY): Payer: Self-pay | Admitting: *Deleted

## 2014-11-16 ENCOUNTER — Telehealth (HOSPITAL_COMMUNITY): Payer: Self-pay | Admitting: *Deleted

## 2014-11-16 NOTE — Telephone Encounter (Signed)
Preadmission screen  

## 2014-11-21 ENCOUNTER — Other Ambulatory Visit: Payer: Self-pay | Admitting: Obstetrics and Gynecology

## 2014-11-22 ENCOUNTER — Inpatient Hospital Stay (HOSPITAL_COMMUNITY)
Admission: RE | Admit: 2014-11-22 | Discharge: 2014-11-24 | DRG: 775 | Disposition: A | Discharge status: Home / Self Care | Payer: Medicaid Other | Source: Ambulatory Visit | Attending: Obstetrics and Gynecology | Admitting: Obstetrics and Gynecology

## 2014-11-22 ENCOUNTER — Inpatient Hospital Stay (HOSPITAL_COMMUNITY): Payer: Medicaid Other | Admitting: Anesthesiology

## 2014-11-22 ENCOUNTER — Encounter (HOSPITAL_COMMUNITY): Payer: Self-pay

## 2014-11-22 VITALS — BP 102/70 | HR 79 | Temp 98.0°F | Resp 18 | Ht 64.0 in | Wt 189.0 lb

## 2014-11-22 DIAGNOSIS — Z3A39 39 weeks gestation of pregnancy: Secondary | ICD-10-CM | POA: Diagnosis not present

## 2014-11-22 DIAGNOSIS — G43709 Chronic migraine without aura, not intractable, without status migrainosus: Secondary | ICD-10-CM

## 2014-11-22 DIAGNOSIS — Z3403 Encounter for supervision of normal first pregnancy, third trimester: Secondary | ICD-10-CM | POA: Diagnosis present

## 2014-11-22 DIAGNOSIS — Z3490 Encounter for supervision of normal pregnancy, unspecified, unspecified trimester: Secondary | ICD-10-CM

## 2014-11-22 LAB — CBC
HEMATOCRIT: 38.9 % (ref 36.0–46.0)
HEMOGLOBIN: 12.8 g/dL (ref 12.0–15.0)
MCH: 27.1 pg (ref 26.0–34.0)
MCHC: 32.9 g/dL (ref 30.0–36.0)
MCV: 82.2 fL (ref 78.0–100.0)
Platelets: 241 10*3/uL (ref 150–400)
RBC: 4.73 MIL/uL (ref 3.87–5.11)
RDW: 14.2 % (ref 11.5–15.5)
WBC: 16 10*3/uL — AB (ref 4.0–10.5)

## 2014-11-22 LAB — RPR: RPR Ser Ql: NONREACTIVE

## 2014-11-22 MED ORDER — OXYCODONE-ACETAMINOPHEN 5-325 MG PO TABS: 2.0000 | ORAL_TABLET | ORAL | Status: DC | PRN

## 2014-11-22 MED ORDER — PHENYLEPHRINE 40 MCG/ML (10ML) SYRINGE FOR IV PUSH (FOR BLOOD PRESSURE SUPPORT)
80.0000 ug | PREFILLED_SYRINGE | INTRAVENOUS | Status: DC | PRN
  Filled 2014-11-22: qty 2

## 2014-11-22 MED ORDER — LACTATED RINGERS IV SOLN
500.0000 mL | INTRAVENOUS | Status: DC | PRN
  Administered 2014-11-22 (×2): 500 mL via INTRAVENOUS

## 2014-11-22 MED ORDER — MEASLES, MUMPS & RUBELLA VAC ~~LOC~~ INJ
0.5000 mL | INJECTION | Freq: Once | SUBCUTANEOUS | Status: DC
  Filled 2014-11-22: qty 0.5

## 2014-11-22 MED ORDER — PRENATAL MULTIVITAMIN CH: 1.0000 | ORAL_TABLET | Freq: Every day | ORAL | Status: DC

## 2014-11-22 MED ORDER — OXYTOCIN 40 UNITS IN LACTATED RINGERS INFUSION - SIMPLE MED
62.5000 mL/h | INTRAVENOUS | Status: AC
  Administered 2014-11-22: 62.5 mL/h via INTRAVENOUS

## 2014-11-22 MED ORDER — EPHEDRINE 5 MG/ML INJ
10.0000 mg | INTRAVENOUS | Status: DC | PRN
  Filled 2014-11-22: qty 2

## 2014-11-22 MED ORDER — PRENATAL MULTIVITAMIN CH
1.0000 | ORAL_TABLET | Freq: Every day | ORAL | Status: DC
  Administered 2014-11-23 – 2014-11-24 (×2): 1 via ORAL
  Filled 2014-11-22 (×2): qty 1

## 2014-11-22 MED ORDER — OXYTOCIN BOLUS FROM INFUSION: 500.0000 mL | INTRAVENOUS | Status: DC

## 2014-11-22 MED ORDER — DIPHENHYDRAMINE HCL 50 MG/ML IJ SOLN: 12.5000 mg | INTRAMUSCULAR | Status: DC | PRN

## 2014-11-22 MED ORDER — IBUPROFEN 600 MG PO TABS
600.0000 mg | ORAL_TABLET | Freq: Four times a day (QID) | ORAL | Status: DC
  Administered 2014-11-22 – 2014-11-24 (×6): 600 mg via ORAL
  Filled 2014-11-22 (×7): qty 1

## 2014-11-22 MED ORDER — OXYTOCIN 40 UNITS IN LACTATED RINGERS INFUSION - SIMPLE MED
62.5000 mL/h | INTRAVENOUS | Status: DC
  Filled 2014-11-22: qty 1000

## 2014-11-22 MED ORDER — BENZOCAINE-MENTHOL 20-0.5 % EX AERO
1.0000 "application " | INHALATION_SPRAY | CUTANEOUS | Status: DC | PRN
  Administered 2014-11-22 – 2014-11-23 (×2): 1 via TOPICAL
  Filled 2014-11-22 (×2): qty 56

## 2014-11-22 MED ORDER — OXYTOCIN 40 UNITS IN LACTATED RINGERS INFUSION - SIMPLE MED
1.0000 m[IU]/min | INTRAVENOUS | Status: DC
  Administered 2014-11-22: 1 m[IU]/min via INTRAVENOUS
  Filled 2014-11-22: qty 1000

## 2014-11-22 MED ORDER — LIDOCAINE HCL (PF) 1 % IJ SOLN
30.0000 mL | INTRAMUSCULAR | Status: DC | PRN
  Administered 2014-11-22: 30 mL via SUBCUTANEOUS
  Filled 2014-11-22: qty 30

## 2014-11-22 MED ORDER — ONDANSETRON HCL 4 MG PO TABS: 4.0000 mg | ORAL_TABLET | ORAL | Status: DC | PRN

## 2014-11-22 MED ORDER — TETANUS-DIPHTH-ACELL PERTUSSIS 5-2.5-18.5 LF-MCG/0.5 IM SUSP: 0.5000 mL | Freq: Once | INTRAMUSCULAR | Status: DC

## 2014-11-22 MED ORDER — LANOLIN HYDROUS EX OINT: TOPICAL_OINTMENT | CUTANEOUS | Status: DC | PRN

## 2014-11-22 MED ORDER — SENNOSIDES-DOCUSATE SODIUM 8.6-50 MG PO TABS
2.0000 | ORAL_TABLET | ORAL | Status: DC
  Administered 2014-11-22 – 2014-11-23 (×2): 2 via ORAL
  Filled 2014-11-22 (×2): qty 2

## 2014-11-22 MED ORDER — FENTANYL 2.5 MCG/ML BUPIVACAINE 1/10 % EPIDURAL INFUSION (WH - ANES)
INTRAMUSCULAR | Status: AC
  Administered 2014-11-22: 14 mL/h via EPIDURAL
  Filled 2014-11-22: qty 125

## 2014-11-22 MED ORDER — ACETAMINOPHEN 325 MG PO TABS
650.0000 mg | ORAL_TABLET | ORAL | Status: DC | PRN
Start: 2014-11-22 — End: 2014-11-24

## 2014-11-22 MED ORDER — WITCH HAZEL-GLYCERIN EX PADS: 1.0000 "application " | MEDICATED_PAD | CUTANEOUS | Status: DC | PRN

## 2014-11-22 MED ORDER — LACTATED RINGERS IV SOLN
INTRAVENOUS | Status: DC
  Administered 2014-11-22 (×3): via INTRAVENOUS

## 2014-11-22 MED ORDER — OXYCODONE-ACETAMINOPHEN 5-325 MG PO TABS: 1.0000 | ORAL_TABLET | ORAL | Status: DC | PRN

## 2014-11-22 MED ORDER — DIBUCAINE 1 % RE OINT: 1.0000 "application " | TOPICAL_OINTMENT | RECTAL | Status: DC | PRN

## 2014-11-22 MED ORDER — LIDOCAINE HCL (PF) 1 % IJ SOLN
INTRAMUSCULAR | Status: DC | PRN
  Administered 2014-11-22: 2 mL via EPIDURAL
  Administered 2014-11-22: 5 mL via EPIDURAL
  Administered 2014-11-22: 3 mL via EPIDURAL

## 2014-11-22 MED ORDER — ZOLPIDEM TARTRATE 5 MG PO TABS: 5.0000 mg | ORAL_TABLET | Freq: Every evening | ORAL | Status: DC | PRN

## 2014-11-22 MED ORDER — OXYTOCIN 40 UNITS IN LACTATED RINGERS INFUSION - SIMPLE MED
62.5000 mL/h | INTRAVENOUS | Status: DC
  Administered 2014-11-22: 62.5 mL/h via INTRAVENOUS

## 2014-11-22 MED ORDER — DIPHENHYDRAMINE HCL 25 MG PO CAPS: 25.0000 mg | ORAL_CAPSULE | Freq: Four times a day (QID) | ORAL | Status: DC | PRN

## 2014-11-22 MED ORDER — ONDANSETRON HCL 4 MG/2ML IJ SOLN: 4.0000 mg | INTRAMUSCULAR | Status: DC | PRN

## 2014-11-22 MED ORDER — SIMETHICONE 80 MG PO CHEW: 80.0000 mg | CHEWABLE_TABLET | ORAL | Status: DC | PRN

## 2014-11-22 MED ORDER — FENTANYL 2.5 MCG/ML BUPIVACAINE 1/10 % EPIDURAL INFUSION (WH - ANES)
14.0000 mL/h | INTRAMUSCULAR | Status: DC | PRN
  Administered 2014-11-22 (×2): 14 mL/h via EPIDURAL

## 2014-11-22 MED ORDER — PHENYLEPHRINE 40 MCG/ML (10ML) SYRINGE FOR IV PUSH (FOR BLOOD PRESSURE SUPPORT)
PREFILLED_SYRINGE | INTRAVENOUS | Status: AC
  Filled 2014-11-22: qty 20

## 2014-11-22 MED ORDER — LACTATED RINGERS IV SOLN: INTRAVENOUS | Status: AC

## 2014-11-22 NOTE — Progress Notes (Signed)
Patient ID: [name], female   DOB: January 19, 1996, 19 y.o.   MRN: [id number] Pt admitted for induction. Pitocin at 3 mu/ minute and pt is contracting painlessly.

## 2014-11-22 NOTE — Anesthesia Preprocedure Evaluation (Addendum)
Anesthesia Evaluation  Patient identified by MRN, date of birth, ID band Patient awake    Reviewed: Allergy & Precautions, NPO status , Patient's Chart, lab work & pertinent test results  Airway Mallampati: III  TM Distance: >3 FB Neck ROM: Full    Dental  (+) Teeth Intact, Dental Advisory Given   Pulmonary neg pulmonary ROS,    Pulmonary exam normal breath sounds clear to auscultation       Cardiovascular Exercise Tolerance: Good negative cardio ROS Normal cardiovascular exam Rhythm:Regular Rate:Normal     Neuro/Psych  Headaches, negative psych ROS   GI/Hepatic negative GI ROS, Neg liver ROS,   Endo/Other  Obesity   Renal/GU negative Renal ROS     Musculoskeletal negative musculoskeletal ROS (+)   Abdominal   Peds  Hematology negative hematology ROS (+)   Anesthesia Other Findings Day of surgery medications reviewed with the patient.  Reproductive/Obstetrics (+) Pregnancy                             Anesthesia Physical Anesthesia Plan  ASA: II  Anesthesia Plan: Epidural   Post-op Pain Management:    Induction:   Airway Management Planned:   Additional Equipment:   Intra-op Plan:   Post-operative Plan:   Informed Consent: I have reviewed the patients History and Physical, chart, labs and discussed the procedure including the risks, benefits and alternatives for the proposed anesthesia with the patient or authorized representative who has indicated his/her understanding and acceptance.   Dental advisory given  Plan Discussed with:   Anesthesia Plan Comments: (Patient identified. Risks/Benefits/Options discussed with patient including but not limited to bleeding, infection, nerve damage, paralysis, failed block, incomplete pain control, headache, blood pressure changes, nausea, vomiting, reactions to medication both or allergic, itching and postpartum back pain. Confirmed  with bedside nurse the patient's most recent platelet count. Confirmed with patient that they are not currently taking any anticoagulation, have any bleeding history or any family history of bleeding disorders. Patient expressed understanding and wished to proceed. All questions were answered. )        Anesthesia Quick Evaluation

## 2014-11-22 NOTE — Progress Notes (Signed)
Patient ID: [name], female   DOB: 30-Jan-1995, 19 y.o.   MRN: [id number] Pt reached full dilatation and is pushing FHR category 1

## 2014-11-22 NOTE — Progress Notes (Signed)
Patient ID: [name], female   DOB: May 01, 1995, 19 y.o.   MRN: [id number] Pt has received an epidural and is comfortable. Before the epidural the cervix was 4-5 cm per the RN. The pitocin is at 9 mu/minute and the contractions are q 2-3 minutes. The FHR tracing is category 1. The cervix is 4-5 cm 90 % effaced and the vertex is at -1/0 station. AROM produced clear fluid.

## 2014-11-22 NOTE — Progress Notes (Signed)
Patient ID: [name], female   DOB: 24-Dec-1995, 19 y.o.   MRN: [id number] Contractions q 2-3 minutes and the cervix is 5 cm 100% effaced and the vertex is at 0/+1 station.

## 2014-11-22 NOTE — Progress Notes (Signed)
Patient ID: [name], female   DOB: October 10, 1995, 19 y.o.   MRN: [id number] Delivery note:  The pt pushed well and made slow but steady descent and delivered a living female infant OA over a second degree ML laceration. Apgars were 9 and 9 at 1 and 5 minutes.The placenta delivered intact and the uterus was normal. The ML laceration was repaired with 3-0 vicryl under local block. There were bilateral labial lacerations. The left was hemostatic and not repaired and the right was sutured with 3-0 vicryl for hemostasis under local block. EBL 222 cc's.

## 2014-11-22 NOTE — Anesthesia Procedure Notes (Signed)

## 2014-11-23 LAB — CBC
HCT: 33.3 % — ABNORMAL LOW (ref 36.0–46.0)
Hemoglobin: 10.8 g/dL — ABNORMAL LOW (ref 12.0–15.0)
MCH: 26.7 pg (ref 26.0–34.0)
MCHC: 32.4 g/dL (ref 30.0–36.0)
MCV: 82.4 fL (ref 78.0–100.0)
PLATELETS: 208 10*3/uL (ref 150–400)
RBC: 4.04 MIL/uL (ref 3.87–5.11)
RDW: 14.3 % (ref 11.5–15.5)
WBC: 23.2 10*3/uL — ABNORMAL HIGH (ref 4.0–10.5)

## 2014-11-23 NOTE — Progress Notes (Signed)
UR chart review completed.  

## 2014-11-23 NOTE — Progress Notes (Signed)
Patient ID: [name], female   DOB: November 29, 1995, 19 y.o.   MRN: [id number] #1 afebrile BP normal HGB stable no complaints

## 2014-11-23 NOTE — Lactation Note (Signed)
This note was copied from the chart of [name]. Lactation Consultation Note Follow up visit at 29 hours of age requested due to nipple trauma and pain with latching.  MBU RN assisting with hand expression and collected 5mls.  LC instructed mom on use on NS that she has been using with poor application.  Mom is able to return demonstration.  Baby latched well with cross cradle hold on left breast.  Stimulation to maintain feeding and syringe fed at the breast 5mls of EBM.  Baby tolerated well.  [facility].  Mom is feeling encouraged with plan and rates pain as only 3/10 much improved.  Mom to call for assist as needed.  Report given at bedside [facility].     Patient Name: [name] ZOXWR'UToday's Date: 11/23/2014 Reason for consult: Follow-up assessment;Breast/nipple pain;Difficult latch   Maternal Data Has patient been taught Hand Expression?: Yes  Feeding Feeding Type: Breast Fed Length of feed:  (about 10 minutes of nutritive sucking)  LATCH Score/Interventions Latch: Grasps breast easily, tongue down, lips flanged, rhythmical sucking. Intervention(s): Skin to skin;Teach feeding cues;Waking techniques  Audible Swallowing: A few with stimulation Intervention(s): Skin to skin;Hand expression  Type of Nipple: Flat  Comfort (Breast/Nipple): Filling, red/small blisters or bruises, mild/mod discomfort  Problem noted: Mild/Moderate discomfort;Cracked, bleeding, blisters, bruises Interventions (Mild/moderate discomfort): Comfort gels;Hand massage;Hand expression  Hold (Positioning): Assistance needed to correctly position infant at breast and maintain latch. Intervention(s): Breastfeeding basics reviewed;Support Pillows;Position options;Skin to skin  LATCH Score: 6  Lactation Tools Discussed/Used Tools: Nipple Shields Nipple shield size: 20 Shell Type: Sore Breast pump type: Manual   Consult Status Consult Status:  Follow-up Date: 11/24/14 Follow-up type: In-patient    Jannifer RodneyShoptaw, Jana Lynn 11/23/2014, 11:05 PM

## 2014-11-23 NOTE — Anesthesia Postprocedure Evaluation (Signed)
  Anesthesia Post-op Note  Patient: [name]  Procedure(s) Performed: * No procedures listed *  Patient Location: Mother/Baby  Anesthesia Type:Epidural  Level of Consciousness: awake, alert , oriented and patient cooperative  Airway and Oxygen Therapy: Patient Spontanous Breathing  Post-op Pain: none  Post-op Assessment: Post-op Vital signs reviewed, Patient's Cardiovascular Status Stable, Respiratory Function Stable, Patent Airway, No headache, No backache and Patient able to bend at knees              Post-op Vital Signs: Reviewed and stable  Last Vitals:  Filed Vitals:   11/23/14 0700  BP: 106/60  Pulse: 64  Temp: 36.5 C  Resp: 20    Complications: No apparent anesthesia complications

## 2014-11-23 NOTE — Lactation Note (Signed)
This note was copied from the chart of [name]. Lactation Consultation Note New mom hasn't been able to latch baby d/t flat nipples. Breast has been leaking for months. Breast are filling and leaking colostrum. Mom BF baby in cradle position to Lt. Breast and has multiple red/purple bruises to areola. Explained to mom about having flat nipples, will evert a tiny bit for a minute. Not compressible d/t fullness of breast. Fitted #16 nipple shield to small nipples, application taught and information paper given. Shells given to wear between feedings. Encouraged to to wear with full breast. Gave hand pump w/#21 flanges. Has personal DEBP in car. Will get FOB to bring in.  Hand expression taught and collected 10ml off of Rt. Breast and still wanting to drip. Encouraged mom to hand pump and relieve breast. Reviewed supply and demand. Discussed positioning. Referred to Baby and Me Book in Breastfeeding section Pg. 22-23 for position options and Proper latch demonstration. Educated about newborn behavior. Mom encouraged to do skin-to-skin. [facility] brochure given w/resources, support groups and LC services. Encouraged pt. To call Nursing to assist in laching for next feeding.  Patient Name: Crystal Rosalita ChessmanBonnie Almanza RUEAV'WToday's Date: 11/23/2014 Reason for consult: Initial assessment   Maternal Data Has patient been taught Hand Expression?: Yes Does the patient have breastfeeding experience prior to this delivery?: No  Feeding Feeding Type: Breast Fed Length of feed: 20 min  LATCH Score/Interventions Latch: Repeated attempts needed to sustain latch, nipple held in mouth throughout feeding, stimulation needed to elicit sucking reflex. Intervention(s): Skin to skin;Teach feeding cues;Waking techniques Intervention(s): Adjust position;Assist with latch;Breast massage;Breast compression  Audible Swallowing: Spontaneous and intermittent Intervention(s): Skin to skin;Hand expression  Type of Nipple:  Flat Intervention(s): Shells;Hand pump  Comfort (Breast/Nipple): Filling, red/small blisters or bruises, mild/mod discomfort     Hold (Positioning): Full assist, staff holds infant at breast Intervention(s): Breastfeeding basics reviewed;Support Pillows;Position options;Skin to skin  LATCH Score: 5  Lactation Tools Discussed/Used Tools: Shells;Nipple Shields;Pump;Flanges Nipple shield size: 16 Flange Size: Other (comment) (#21) Shell Type: Inverted Breast pump type: Manual Pump Review: Setup, frequency, and cleaning;Milk Storage Initiated by:: [name] RN Date initiated:: 11/23/14   Consult Status Consult Status: Follow-up Date: 11/24/14 Follow-up type: In-patient    CARVER, Diamond NickelLAURA G 11/23/2014, 7:06 AM

## 2014-11-23 NOTE — H&P (Signed)
NAMRosalita Frazier:  Frazier, [name]             ACCOUNT NO.:  [id number]  MEDICAL RECORD NO.:  [id number]  LOCATION:                                FACILITY:  [facility]  PHYSICIAN:  Malachi Prohomas F. Ambrose MantleHenley, M.D. DATE OF BIRTH:  08-25-1995  DATE OF ADMISSION:  11/22/2014 DATE OF DISCHARGE:                             HISTORY & PHYSICAL   HISTORY OF PRESENT ILLNESS:  This is a 19 year old white female, para 0, gravida 1; EDC November 26, 2014; admitted for induction of labor. Ultrasound on April 10, 2014; [facility] on April 26, 2014; [facility] Blood group and type O positive, negative antibody.  RPR negative. Hepatitis B surface antigen negative.  HIV negative.  GC and Chlamydia negative.  Varicella immune.  Rubella immune.  One-hour Glucose 72. Repeat HIV and RPR negative.  GC and Chlamydia negative and group B strep negative.  The patient began her prenatal course early in pregnancy.  She was seen at 11 weeks for having had a fever of 103.  No medications.  She did have quad screen done that was negative.  She saw a neurologist.  No treatment recommended except over-the-counter magnesium.  Initial platelet count was 120, repeat at 15 weeks was normal.  She was seen again in [facility] at 22 weeks for mild dehydration. She was advised at 27 weeks to watch her weight.  At 33 weeks, she was advised of a 41.1 pound weight gain during the pregnancy.  By 37 weeks, she had gained 46 pounds and at the final exam again 48 pounds.  Her blood pressure remained stable throughout the pregnancy.  She is admitted now for induction of labor.  PAST MEDICAL HISTORY:  She does have a history of migraines.  PAST SURGICAL HISTORY:  None.  ALLERGIES:  She has no known drug allergies.  No latex or food allergies.  MEDICATIONS:  Prenatal vitamins.  FAMILY HISTORY:  Father with high blood pressure.  SOCIAL HISTORY:  She never smoked.  She does not drink.  Denied  drugs. Caucasian, 12 years of education.  PHYSICAL EXAMINATION:  VITAL SIGNS:  On admission, blood pressure is 110/76, pulse is 70. HEART:  Normal size and sounds.  No murmurs. LUNGS:  Clear to auscultation. ABDOMEN:  Fundal height 38 cm.  Fetal heart tones normal. PELVIC:  Cervix 2 cm, 50%, vertex at a -2.  ADMITTING IMPRESSION:  Intrauterine pregnancy at 39 weeks and 3 days. The patient is admitted for induction of labor.  She understands the process and is ready to proceed.     Malachi Prohomas F. Ambrose MantleHenley, M.D.     TFH/MEDQ  D:  11/21/2014  T:  11/21/2014  Job:  [id number]

## 2014-11-24 MED ORDER — IBUPROFEN 600 MG PO TABS: 600.0000 mg | ORAL_TABLET | Freq: Four times a day (QID) | ORAL | Status: DC | PRN

## 2014-11-24 NOTE — Lactation Note (Signed)
This note was copied from the chart of Crystal Rosalita ChessmanBonnie Karasik. Lactation Consultation Note  Patient Name: [name] EAVWU'JToday's Date: 11/24/2014 Reason for consult: Follow-up assessment;Breast/nipple pain  42 hours old and heading home today . Mom has had challenges with sore nipples requiring the use of Nipple Shield  For latching and supplementing afterwards when the latching if difficult . Per mom still has a strong desire to breast feed.  LC assessed sore nipples with mom's permission and noted both nipples to still appear pinky red , small cracks , no scabs,  Some bruising. LC reassessed  For the size of the Nipple Shield and felt the #20 NS was snug, had mom massage , hand express, ' Pre - pump with hand pump short stroke and then fitted with #24 NS and noted the fit to be so improved.  Per mom comfortable. Baby showing hunger cues, LC changed a heavy wet diaper , and assisted mom to latch in football position  With the #24 NS . Baby latched right on with wide open mouth and depth. Consistent pattern with multiply swallows noted and increased  with breast compressions. Baby released and nipple normal shape. Baby still hungry so due to sore nipple tx , had mom supplement with a bottle  28 ml of formula . Baby satisfied after feeding. Per mom will have a DEBP at home. [facility] p  Appt. Reminder given to mom. Sore nipple and engorgement prevention and tx reviewed ( already has comfort gels , breast shells , hand pump )  LC rechecked flange size due to mom being given a #21 and noted the size to be tight and snug, and causing discomfort. LC increased size back to #24  Flange and per mom much more comfortable. LC stressed to mom NS size if #24 for today , and when milk comes in if to full to pump off enough to fit in the #24.  And stay at the #24 flange ( not the #21 )  Mother informed of post-discharge support and given phone number to the  lactation department, including services for phone call assistance; out-patient appointments; and breastfeeding support group. List of other breastfeeding resources in the community given in the handout. Encouraged mother to call for problems or concerns related to breastfeeding.   Maternal Data Has patient been taught Hand Expression?: Yes  Feeding baby 1st breast fed 13 mins and then mom supplemented with a bottle afterwards 28 ml  Feeding Type: Breast Fed Nipple Type: Slow - flow Length of feed: 40 min  LATCH Score/Interventions Latch: Grasps breast easily, tongue down, lips flanged, rhythmical sucking. Intervention(s): Skin to skin;Teach feeding cues;Waking techniques Intervention(s): Adjust position;Assist with latch;Breast massage;Breast compression  Audible Swallowing: Spontaneous and intermittent  Type of Nipple: Everted at rest and after stimulation Intervention(s):  (shield)  Comfort (Breast/Nipple): Filling, red/small blisters or bruises, mild/mod discomfort  Problem noted: Filling;Mild/Moderate discomfort;Cracked, bleeding, blisters, bruises Interventions  (Cracked/bleeding/bruising/blister): Expressed breast milk to nipple Interventions (Mild/moderate discomfort): Comfort gels;Breast shields  Hold (Positioning): Assistance needed to correctly position infant at breast and maintain latch. Intervention(s): Breastfeeding basics reviewed;Support Pillows;Position options;Skin to skin  LATCH Score: 8  Lactation Tools Discussed/Used Tools: Nipple Shields Nipple shield size: 20 Flange Size: 24 Shell Type: Inverted Breast pump type: Manual (per mom has a DEBP Medela at home ) [name] ) Pump Review: Milk Storage;Setup, frequency, and cleaning   Consult Status Consult  Status: Follow-up Follow-up type: Out-patient    Kathrin Greathouse 11/24/2014, 12:17 PM

## 2014-11-24 NOTE — Discharge Instructions (Signed)
booklet °

## 2014-11-24 NOTE — Discharge Summary (Signed)
NAMRosalita Chessman:  Gayheart, Zenaida             ACCOUNT NO.:  [id number]  MEDICAL RECORD NO.:  [id number]  LOCATION:  9122                          FACILITY:  [facility]  PHYSICIAN:  Malachi Prohomas F. Ambrose MantleHenley, M.D. DATE OF BIRTH:  Jun 06, 1995  DATE OF ADMISSION:  11/22/2014 DATE OF DISCHARGE:  11/24/2014                              DISCHARGE SUMMARY   HOSPITAL COURSE:  A 19 year old white female, para 0, gravida 1, admitted for induction of labor.  The patient was begun on Pitocin.  By 08:45 a.m., the Pitocin was at 3 milliunits per minute; and the patient was contracting painlessly.  She subsequently began to have more painful contractions.  She received an epidural; and by 12:30 p.m., she was comfortable.  Cervix was 4-5 cm per the RN.  Fetal heart rate tracing was category 1.  Artificial rupture of the membranes produced clear fluid.  At 01:43 p.m., the cervix was 5 cm, 100% vertex at a 0 to +1. She reached full dilatation and began pushing.  She pushed well and made slow but steady progress and delivered a living female infant, OA over a second-degree midline laceration by Dr. Ambrose MantleHenley.  Apgars were 9 and 9 at 1 and 5 minutes.  Midline laceration was repaired with 3-0 Vicryl and a local block.  Left labial laceration was hemostatic and not repaired. The right was sutured with 3-0 Vicryl for hemostasis.  Blood loss was 222 mL.  Postpartum, the patient did well and was discharged on the second postpartum day.  Initial hemoglobin was 12.8, hematocrit 38.9, white count 16,000, platelet count 241,000, and followup hemoglobin was 10.8.  FINAL DIAGNOSES:  Intrauterine pregnancy, 39+ weeks, delivered OA.  OPERATIONS:  Spontaneous delivery OA, repair of second-degree midline laceration and right labial laceration.  FINAL CONDITION:  Improved.  INSTRUCTIONS:  Instructions include our regular discharge instruction booklet, our after visit summary, and a prescription for Motrin 600 mg, #30, one every 6 hours as needed  for pain.  She is to return to the office in 6 weeks for followup examination and also to return within 2 weeks to have her baby circumcised.  She is also advised not to have intercourse for 6 weeks, so that she will be a candidate for any method of birth control she chooses at her 6 weeks checkup.     Malachi Prohomas F. Ambrose MantleHenley, M.D.     TFH/MEDQ  D:  11/24/2014  T:  11/24/2014  Job:  [id number]

## 2014-11-24 NOTE — Progress Notes (Signed)
Patient ID: [name], female   DOB: 09-Sep-1995, 19 y.o.   MRN: [id number] #2 afebrile BP normal no complaints for d/c

## 2014-11-28 ENCOUNTER — Encounter: Payer: Self-pay | Admitting: Neurology

## 2014-11-29 ENCOUNTER — Ambulatory Visit (HOSPITAL_COMMUNITY): Payer: Medicaid Other

## 2014-11-29 NOTE — Telephone Encounter (Signed)
Left message for patient encouraging her to reach out to PCP or pediatrician.

## 2015-10-30 ENCOUNTER — Telehealth: Payer: Self-pay

## 2015-10-30 NOTE — Telephone Encounter (Signed)
Error. Wrong patient.

## 2015-10-30 NOTE — Telephone Encounter (Signed)
-----   Message from Richarda OverlieJada A Fox, [place] sent at 11/29/2014  9:42 AM EST ----- Regarding: MRI MRI needed!

## 2015-11-29 ENCOUNTER — Encounter: Payer: Self-pay | Admitting: Anesthesiology

## 2015-11-29 ENCOUNTER — Observation Stay: Payer: 59 | Admitting: Anesthesiology

## 2015-11-29 ENCOUNTER — Observation Stay
Admission: EM | Admit: 2015-11-29 | Discharge: 2015-11-29 | Disposition: A | Discharge status: Home / Self Care | Payer: 59 | Attending: Obstetrics and Gynecology | Admitting: Obstetrics and Gynecology

## 2015-11-29 ENCOUNTER — Encounter
Admission: EM | Disposition: A | Discharge status: Home / Self Care | Payer: Self-pay | Source: Home / Self Care | Attending: Emergency Medicine

## 2015-11-29 DIAGNOSIS — O4690 Antepartum hemorrhage, unspecified, unspecified trimester: Secondary | ICD-10-CM

## 2015-11-29 DIAGNOSIS — O034 Incomplete spontaneous abortion without complication: Principal | ICD-10-CM | POA: Insufficient documentation

## 2015-11-29 DIAGNOSIS — Z79899 Other long term (current) drug therapy: Secondary | ICD-10-CM | POA: Diagnosis not present

## 2015-11-29 DIAGNOSIS — Z9889 Other specified postprocedural states: Secondary | ICD-10-CM

## 2015-11-29 HISTORY — PX: DILATION AND CURETTAGE OF UTERUS: SHX78

## 2015-11-29 LAB — TYPE AND SCREEN
ABO/RH(D): O POS
ANTIBODY SCREEN: NEGATIVE
UNIT DIVISION: 0
UNIT DIVISION: 0
Unit division: 0
Unit division: 0

## 2015-11-29 LAB — CBC
HCT: 27.4 % — ABNORMAL LOW (ref 35.0–47.0)
HEMATOCRIT: 28.2 % — AB (ref 35.0–47.0)
HEMOGLOBIN: 9.4 g/dL — AB (ref 12.0–16.0)
HEMOGLOBIN: 9.3 g/dL — AB (ref 12.0–16.0)
MCH: 28.9 pg (ref 26.0–34.0)
MCH: 29.1 pg (ref 26.0–34.0)
MCHC: 33.3 g/dL (ref 32.0–36.0)
MCHC: 33.9 g/dL (ref 32.0–36.0)
MCV: 86.8 fL (ref 80.0–100.0)
MCV: 85.9 fL (ref 80.0–100.0)
PLATELETS: 208 10*3/uL (ref 150–440)
Platelets: 219 10*3/uL (ref 150–440)
RBC: 3.25 MIL/uL — AB (ref 3.80–5.20)
RBC: 3.19 MIL/uL — AB (ref 3.80–5.20)
RDW: 13.2 % (ref 11.5–14.5)
RDW: 13.5 % (ref 11.5–14.5)
WBC: 16.5 10*3/uL — AB (ref 3.6–11.0)
WBC: 15.3 10*3/uL — AB (ref 3.6–11.0)

## 2015-11-29 LAB — CBC WITH DIFFERENTIAL/PLATELET
BASOS ABS: 0.1 10*3/uL (ref 0–0.1)
Basophils Relative: 1 %
Eosinophils Absolute: 0.6 10*3/uL (ref 0–0.7)
Eosinophils Relative: 4 %
HEMATOCRIT: 39.6 % (ref 35.0–47.0)
HEMOGLOBIN: 13.1 g/dL (ref 12.0–16.0)
LYMPHS PCT: 28 %
Lymphs Abs: 4.2 10*3/uL — ABNORMAL HIGH (ref 1.0–3.6)
MCH: 28.3 pg (ref 26.0–34.0)
MCHC: 33.2 g/dL (ref 32.0–36.0)
MCV: 85.2 fL (ref 80.0–100.0)
MONO ABS: 1.1 10*3/uL — AB (ref 0.2–0.9)
MONOS PCT: 7 %
NEUTROS ABS: 9.2 10*3/uL — AB (ref 1.4–6.5)
NEUTROS PCT: 60 %
Platelets: 312 10*3/uL (ref 150–440)
RBC: 4.65 MIL/uL (ref 3.80–5.20)
RDW: 13.4 % (ref 11.5–14.5)
WBC: 15.1 10*3/uL — ABNORMAL HIGH (ref 3.6–11.0)

## 2015-11-29 LAB — COMPREHENSIVE METABOLIC PANEL
ALBUMIN: 3.7 g/dL (ref 3.5–5.0)
ALK PHOS: 78 U/L (ref 38–126)
ALT: 14 U/L (ref 14–54)
AST: 27 U/L (ref 15–41)
Anion gap: 8 (ref 5–15)
BILIRUBIN TOTAL: 0.1 mg/dL — AB (ref 0.3–1.2)
BUN: 11 mg/dL (ref 6–20)
CALCIUM: 8.9 mg/dL (ref 8.9–10.3)
CO2: 23 mmol/L (ref 22–32)
Chloride: 106 mmol/L (ref 101–111)
Creatinine, Ser: 0.68 mg/dL (ref 0.44–1.00)
GFR calc Af Amer: >60 mL/min (ref >60)
GFR calc non Af Amer: >60 mL/min (ref >60)
GLUCOSE: 107 mg/dL — AB (ref 65–99)
POTASSIUM: 3.8 mmol/L (ref 3.5–5.1)
Sodium: 137 mmol/L (ref 135–145)
TOTAL PROTEIN: 7.5 g/dL (ref 6.5–8.1)

## 2015-11-29 LAB — ABO/RH: ABO/RH(D): O POS

## 2015-11-29 LAB — PREPARE RBC (CROSSMATCH)

## 2015-11-29 SURGERY — DILATION AND CURETTAGE
Anesthesia: General | Site: Uterus | Wound class: Clean Contaminated

## 2015-11-29 SURGERY — DILATION AND CURETTAGE
Anesthesia: General

## 2015-11-29 MED ORDER — MORPHINE SULFATE (PF) 4 MG/ML IV SOLN
2.0000 mg | Freq: Once | INTRAVENOUS | Status: AC
  Administered 2015-11-29: 2 mg via INTRAVENOUS

## 2015-11-29 MED ORDER — OXYCODONE HCL 5 MG PO TABS: 5.0000 mg | ORAL_TABLET | Freq: Once | ORAL | Status: DC | PRN

## 2015-11-29 MED ORDER — MORPHINE SULFATE (PF) 4 MG/ML IV SOLN
INTRAVENOUS | Status: AC
  Filled 2015-11-29: qty 1

## 2015-11-29 MED ORDER — METHYLERGONOVINE MALEATE 0.2 MG/ML IJ SOLN
INTRAMUSCULAR | Status: AC
  Filled 2015-11-29: qty 1

## 2015-11-29 MED ORDER — LACTATED RINGERS IV SOLN
INTRAVENOUS | Status: DC | PRN
  Administered 2015-11-29: 05:00:00 via INTRAVENOUS

## 2015-11-29 MED ORDER — SODIUM CHLORIDE 0.9 % IV SOLN: INTRAVENOUS | Status: DC

## 2015-11-29 MED ORDER — METHYLERGONOVINE MALEATE 0.2 MG PO TABS
0.2000 mg | ORAL_TABLET | Freq: Three times a day (TID) | ORAL | Status: DC
  Administered 2015-11-29: 0.2 mg via ORAL
  Filled 2015-11-29: qty 1

## 2015-11-29 MED ORDER — OXYCODONE HCL 5 MG/5ML PO SOLN: 5.0000 mg | Freq: Once | ORAL | Status: DC | PRN

## 2015-11-29 MED ORDER — PROPOFOL 10 MG/ML IV BOLUS
INTRAVENOUS | Status: DC | PRN
  Administered 2015-11-29: 120 mg via INTRAVENOUS

## 2015-11-29 MED ORDER — DOXYCYCLINE HYCLATE 100 MG IV SOLR
100.0000 mg | Freq: Once | INTRAVENOUS | Status: AC
  Administered 2015-11-29: 100 mg via INTRAVENOUS
  Filled 2015-11-29: qty 100

## 2015-11-29 MED ORDER — OXYCODONE-ACETAMINOPHEN 5-325 MG PO TABS: 1.0000 | ORAL_TABLET | ORAL | 0 refills | Status: DC | PRN

## 2015-11-29 MED ORDER — FENTANYL CITRATE (PF) 100 MCG/2ML IJ SOLN
INTRAMUSCULAR | Status: DC | PRN
  Administered 2015-11-29: 100 ug via INTRAVENOUS

## 2015-11-29 MED ORDER — IBUPROFEN 600 MG PO TABS: 600.0000 mg | ORAL_TABLET | Freq: Four times a day (QID) | ORAL | 0 refills | Status: DC | PRN

## 2015-11-29 MED ORDER — ONDANSETRON HCL 4 MG/2ML IJ SOLN
4.0000 mg | Freq: Once | INTRAMUSCULAR | Status: AC
  Administered 2015-11-29: 4 mg via INTRAVENOUS

## 2015-11-29 MED ORDER — ONDANSETRON HCL 4 MG/2ML IJ SOLN
INTRAMUSCULAR | Status: AC
  Filled 2015-11-29: qty 2

## 2015-11-29 MED ORDER — MIDAZOLAM HCL 2 MG/2ML IJ SOLN
INTRAMUSCULAR | Status: DC | PRN
Start: 2015-11-29 — End: 2015-11-29
  Administered 2015-11-29: 2 mg via INTRAVENOUS

## 2015-11-29 MED ORDER — DOXYCYCLINE HYCLATE 100 MG PO TABS
200.0000 mg | ORAL_TABLET | Freq: Once | ORAL | Status: AC
  Administered 2015-11-29: 200 mg via ORAL
  Filled 2015-11-29: qty 2

## 2015-11-29 MED ORDER — SODIUM CHLORIDE 0.9 % IV SOLN
INTRAVENOUS | Status: DC
  Administered 2015-11-29: 07:00:00 via INTRAVENOUS

## 2015-11-29 MED ORDER — FENTANYL CITRATE (PF) 100 MCG/2ML IJ SOLN: 25.0000 ug | INTRAMUSCULAR | Status: DC | PRN

## 2015-11-29 MED ORDER — ONDANSETRON HCL 4 MG/2ML IJ SOLN
4.0000 mg | Freq: Four times a day (QID) | INTRAMUSCULAR | Status: DC | PRN
  Administered 2015-11-29: 4 mg via INTRAVENOUS

## 2015-11-29 MED ORDER — SODIUM CHLORIDE 0.9 % IV SOLN: Freq: Once | INTRAVENOUS | Status: DC

## 2015-11-29 MED ORDER — FERROUS SULFATE 325 (65 FE) MG PO TABS: 325.0000 mg | ORAL_TABLET | Freq: Two times a day (BID) | ORAL | 1 refills | Status: DC

## 2015-11-29 MED ORDER — SODIUM CHLORIDE 0.9 % IV BOLUS (SEPSIS)
1000.0000 mL | Freq: Once | INTRAVENOUS | Status: AC
Start: 2015-11-29 — End: 2015-11-29
  Administered 2015-11-29: 1000 mL via INTRAVENOUS

## 2015-11-29 MED ORDER — METRONIDAZOLE 500 MG PO TABS: 500.0000 mg | ORAL_TABLET | Freq: Two times a day (BID) | ORAL | 0 refills | Status: AC

## 2015-11-29 MED ORDER — ONDANSETRON HCL 4 MG/2ML IJ SOLN
INTRAMUSCULAR | Status: DC | PRN
  Administered 2015-11-29: 4 mg via INTRAVENOUS

## 2015-11-29 MED ORDER — MORPHINE SULFATE (PF) 4 MG/ML IV SOLN
4.0000 mg | Freq: Once | INTRAVENOUS | Status: DC
Start: 2015-11-29 — End: 2015-11-29

## 2015-11-29 MED ORDER — METHYLERGONOVINE MALEATE 0.2 MG/ML IJ SOLN
INTRAMUSCULAR | Status: DC | PRN
  Administered 2015-11-29: 0.2 mg via INTRAMUSCULAR

## 2015-11-29 MED ORDER — ONDANSETRON HCL 4 MG/2ML IJ SOLN: 4.0000 mg | Freq: Once | INTRAMUSCULAR | Status: DC

## 2015-11-29 MED ORDER — OXYCODONE-ACETAMINOPHEN 5-325 MG PO TABS
1.0000 | ORAL_TABLET | ORAL | Status: DC | PRN
  Administered 2015-11-29: 1 via ORAL
  Filled 2015-11-29: qty 2

## 2015-11-29 MED ORDER — IBUPROFEN 600 MG PO TABS: 600.0000 mg | ORAL_TABLET | Freq: Four times a day (QID) | ORAL | Status: DC | PRN

## 2015-11-29 MED ORDER — PHENYLEPHRINE HCL 10 MG/ML IJ SOLN
INTRAMUSCULAR | Status: DC | PRN
  Administered 2015-11-29: 100 ug via INTRAVENOUS

## 2015-11-29 MED ORDER — METRONIDAZOLE 500 MG PO TABS: 500.0000 mg | ORAL_TABLET | Freq: Two times a day (BID) | ORAL | 0 refills | Status: DC

## 2015-11-29 MED ORDER — PROMETHAZINE HCL 25 MG/ML IJ SOLN: 6.2500 mg | INTRAMUSCULAR | Status: DC | PRN

## 2015-11-29 MED ORDER — SODIUM CHLORIDE 0.9 % IV BOLUS (SEPSIS)
1000.0000 mL | Freq: Once | INTRAVENOUS | Status: AC
  Administered 2015-11-29: 1000 mL via INTRAVENOUS

## 2015-11-29 MED ORDER — NORETHIN-ETH ESTRAD-FE BIPHAS 1 MG-10 MCG / 10 MCG PO TABS: 1.0000 | ORAL_TABLET | Freq: Every day | ORAL | 11 refills | Status: DC

## 2015-11-29 MED ORDER — MORPHINE SULFATE (PF) 4 MG/ML IV SOLN: 4.0000 mg | Freq: Once | INTRAVENOUS | Status: DC

## 2015-11-29 MED ORDER — MORPHINE SULFATE (PF) 4 MG/ML IV SOLN
INTRAVENOUS | Status: AC
  Administered 2015-11-29: 2 mg via INTRAVENOUS
  Filled 2015-11-29: qty 1

## 2015-11-29 MED ORDER — METHYLERGONOVINE MALEATE 0.2 MG PO TABS: 0.2000 mg | ORAL_TABLET | Freq: Three times a day (TID) | ORAL | 0 refills | Status: DC

## 2015-11-29 MED ORDER — ONDANSETRON HCL 4 MG PO TABS: 4.0000 mg | ORAL_TABLET | Freq: Four times a day (QID) | ORAL | Status: DC | PRN

## 2015-11-29 MED ORDER — MENTHOL 3 MG MT LOZG
1.0000 | LOZENGE | OROMUCOSAL | Status: DC | PRN
  Filled 2015-11-29: qty 9

## 2015-11-29 MED ORDER — LIDOCAINE HCL (CARDIAC) 20 MG/ML IV SOLN
INTRAVENOUS | Status: DC | PRN
Start: 2015-11-29 — End: 2015-11-29
  Administered 2015-11-29: 60 mg via INTRAVENOUS

## 2015-11-29 SURGICAL SUPPLY — 10 items
CATH ROBINSON RED A/P 16FR (CATHETERS) ×2 IMPLANT
GLOVE BIO SURGEON STRL SZ7 (GLOVE) ×3 IMPLANT
GLOVE INDICATOR 7.5 STRL GRN (GLOVE) ×3 IMPLANT
GOWN STRL REUS W/ TWL LRG LVL3 (GOWN DISPOSABLE) ×2 IMPLANT
GOWN STRL REUS W/TWL LRG LVL3 (GOWN DISPOSABLE) ×4
KIT RM TURNOVER CYSTO AR (KITS) ×2 IMPLANT
PACK DNC HYST (MISCELLANEOUS) ×2 IMPLANT
PAD OB MATERNITY 4.3X12.25 (PERSONAL CARE ITEMS) ×2 IMPLANT
PAD PREP 24X41 OB/GYN DISP (PERSONAL CARE ITEMS) ×2 IMPLANT
VACURETTE 8MM F TIP (MISCELLANEOUS) ×2 IMPLANT

## 2015-11-29 NOTE — Discharge Instructions (Signed)
Pelvic Rest °Pelvic rest is sometimes recommended for women when:  °· The placenta is partially or completely covering the opening of the cervix (placenta previa). °· There is bleeding between the uterine wall and the amniotic sac in the first trimester (subchorionic hemorrhage). °· The cervix begins to open without labor starting (incompetent cervix, cervical insufficiency). °· The labor is too early (preterm labor). °HOME CARE INSTRUCTIONS °· Do not have sexual intercourse, stimulation, or an orgasm. °· Do not use tampons, douche, or put anything in the vagina. °· Do not lift anything over 10 pounds (4.5 kg). °· Avoid strenuous activity or straining your pelvic muscles. °SEEK MEDICAL CARE IF:  °· You have any vaginal bleeding during pregnancy. Treat this as a potential emergency. °· You have cramping pain felt low in the stomach (stronger than menstrual cramps). °· You notice vaginal discharge (watery, mucus, or bloody). °· You have a low, dull backache. °· There are regular contractions or uterine tightening. °SEEK IMMEDIATE MEDICAL CARE IF: °You have vaginal bleeding and have placenta previa.  °  °This information is not intended to replace advice given to you by your health care provider. Make sure you discuss any questions you have with your health care provider. °  °Document Released: 05/03/2010 Document Revised: 03/31/2011 Document Reviewed: 07/10/2014 °Elsevier Interactive Patient Education ©2016 [employer] ° °

## 2015-11-29 NOTE — Op Note (Signed)
Patient Name: [name] Date of Procedure: 11/29/15   Preoperative Diagnosis: 1) 20 y.o. with incomplete abortion  Postoperative Diagnosis: 1) 20 y.o. with incomplete abortion  Operation Performed: Suction dilation and curettage  Indication: Failed medical management of 8 week missed abortion  Anesthesia: General  Primary Surgeon: Vena AustriaAndreas Eiliyah Reh, MD  Assistant: none  Preoperative Antibiotics:100mg  of doxycylube  Estimated Blood Loss: 200mL  IV Fluids: 900mL  Urine Output:: ~1850mL straight cath  Drains or Tubes: none  Implants: none  Specimens Removed: Products of conception  Complications: none  Intraoperative Findings:  Dilated cervix, some products were now visibile at the os and removed with ring forceps, minimal amount of additional tissue removed with D&C  Patient Condition: stable  Procedure in Detail:  Patient was taken to the operating room were she was administered general endotracheal anesthesia.  She was positioned in the dorsal lithotomy position utilizing Allen stirups, prepped and draped in the usual sterile fashion.  Uterus was noted to be 6-8 weeks in size, anteverted.   Prior to proceeding with the case a time out was performed.  Attention was turned to the patient's pelvis.  A red rubber catheter was used to empty the patient's bladder.  An operative speculum was placed to allow visualization of the cervix.  The anterior lip of the cervix was grasped with a ring forceps given advanced degree of cervical dilation.  Products at os removed with ring forceps.  Continued bleeding was noted.  A size 8 flexible suction curette was advanced into the uterus and after several passes a sharp curette revealed good uterine cry throughout.  Given continued bleeding and rigid size 10 flexed curette was also utilized for two passes and 0.2mg  of IM methergine was administered along with bimanual massage with good hemostasis noted after these interventions.    The  ring forceps was removed from the cervix.  The cervix were noted to be hemostatic before removing the operative speculum.  Sponge needle and instrument counts were corrects times two.  The patient tolerated the procedure well and was taken to the recovery room in stable condition.

## 2015-11-29 NOTE — Transfer of Care (Signed)
Immediate Anesthesia Transfer of Care Note  Patient: [name]  Procedure(s) Performed: Procedure(s): DILATATION AND CURETTAGE (N/A)  Patient Location: PACU  Anesthesia Type:General  Level of Consciousness: awake, alert  and patient cooperative  Airway & Oxygen Therapy: Patient Spontanous Breathing  Post-op Assessment: Report given to RN and Post -op Vital signs reviewed and stable  Post vital signs: Reviewed and stable  Last Vitals:  Vitals:   11/29/15 0400 11/29/15 0406  BP: 116/66 115/79  Pulse: (!) 101 (!) 112  Resp: 19 (!) 25  Temp:      Last Pain:  Vitals:   11/29/15 0324  TempSrc: Oral  PainSc: 5          Complications: No apparent anesthesia complications

## 2015-11-29 NOTE — ED Triage Notes (Addendum)
Pt to triage via w/c, appears uncomfortable; rx misoprostol  Friday; began heavy bleeding and cramping tonight; pt taken immed to room 16 and placed on card monitor; Dr Darnelle CatalanMalinda called to room for further evaluation

## 2015-11-29 NOTE — Anesthesia Procedure Notes (Signed)
Procedure Name: Intubation Date/Time: 11/29/2015 4:44 AM Performed by: Waldo LaineJUSTIS, [name] Pre-anesthesia Checklist: Emergency Drugs available, Patient identified, Suction available, Patient being monitored and Timeout performed Patient Re-evaluated:Patient Re-evaluated prior to inductionOxygen Delivery Method: Circle system utilized Preoxygenation: Pre-oxygenation with 100% oxygen Intubation Type: IV induction, Rapid sequence and Cricoid Pressure applied Laryngoscope Size: Miller and 2 Grade View: Grade I Tube type: Oral Number of attempts: 1 Airway Equipment and Method: Stylet Placement Confirmation: ETT inserted through vocal cords under direct vision Secured at: 20 cm Tube secured with: Tape Dental Injury: Teeth and Oropharynx as per pre-operative assessment

## 2015-11-29 NOTE — Anesthesia Postprocedure Evaluation (Signed)
Anesthesia Post Note  Patient: Crystal KoyanagiBonnie M Frazier  Procedure(s) Performed: Procedure(s) (LRB): DILATATION AND CURETTAGE (N/A)  Patient location during evaluation: PACU Anesthesia Type: General Level of consciousness: awake and alert Pain management: pain level controlled Vital Signs Assessment: post-procedure vital signs reviewed and stable Respiratory status: spontaneous breathing, nonlabored ventilation, respiratory function stable and patient connected to nasal cannula oxygen Cardiovascular status: blood pressure returned to baseline and stable Postop Assessment: no signs of nausea or vomiting Anesthetic complications: no    Last Vitals:  Vitals:   11/29/15 0559 11/29/15 0639  BP:  106/62  Pulse: 99 (!) 101  Resp: (!) 26 16  Temp: 36.7 C 36.9 C    Last Pain:  Vitals:   11/29/15 0639  TempSrc: Oral  PainSc: 0-No pain                 Cleda MccreedyJoseph K Ronny Korff

## 2015-11-29 NOTE — ED Notes (Addendum)
Dr. Bonney AidStaebler assessed patient at bedside and notified pt that she would have to go to OR.  Pt verbalized understand.  MD obtained consents from patient.  [place]  Juanetta, NT to transport pt to OR.

## 2015-11-29 NOTE — ED Provider Notes (Signed)
[facility] Provider Note   ____________________________________________   First MD Initiated Contact with Patient 11/29/15 [phone]     (approximate)  I have reviewed the triage vital signs and the nursing notes.   HISTORY  Chief Complaint Vaginal Bleeding   HPI [name] is a 20 y.o. female who had an 11 week pregnancy with no heartbeat. Size the pregnancy she reports was 8 weeks. She [place] mist [place] She began having heavy cramping and bleeding tonight. On arrival here she is tachycardic rate of 160 she says she is not lightheaded his bleeding very heavily. She complains of severe crampy pain. On examination she is tachycardic but no abdominal pain on palpation. GYN exam shows multiple blood clots filling the vagina and blood dripping out the vagina. Suction was applied ring forceps were used tointo the vagina I emptied it once she had more cramps filled up with clots again and stated again she filled up with clots again I'm not calling OB/GYN. She has one IV running and will get anooing and type and cross her.  Past Medical History:  Diagnosis Date  . Anemia   . Anemia   . Headache     Patient Active Problem List   Diagnosis Date Noted  . Pregnancy 11/22/2014  . SVD (spontaneous vaginal delivery) 11/22/2014  . Chronic migraine without aura without status migrainosus, not intractable 05/15/2014    No past surgical history on file.  Prior to Admission medications   Medication Sig Start Date End Date Taking? Authorizing Provider  ibuprofen (ADVIL,MOTRIN) 600 MG tablet Take 1 tablet (600 mg total) by mouth every 6 (six) hours as needed. 11/24/14   Tracey Harrieshomas Henley, MD  Prenatal Vit-Fe Fumarate-FA (PRENATAL MULTIVITAMIN) TABS tablet Take 1 tablet by mouth daily at 12 noon.    Historical Provider, MD    Allergies Patient has no known allergies.  Family History  Problem Relation Age of  Onset  . Hyperlipidemia Father   . Gout Father     Social History Social History  Substance Use Topics  . Smoking status: Never Smoker  . Smokeless tobacco: Never Used  . Alcohol use No    Review of Systems Constitutional: No fever/chills Eyes: No visual changes. ENT: No sore throat. Cardiovascular: Denies chest pain. Respiratory: Denies shortness of breath. Gastrointestinal: crampy abdominal pain No nausea, no vomiting.  No diarrhea.  No constipation. Genitourinary: Negative for dysuria.  10-point ROS otherwise negative.  ____________________________________________   PHYSICAL EXAM:  VITAL SIGNS: ED Triage Vitals [11/29/15 0324]  Enc Vitals Group     BP 127/69     Pulse Rate (!) 160     Resp 20     Temp 97.8 F (36.6 C)     Temp Source Oral     SpO2 100 %     Weight 150 lb (68 kg)     Height 5\' 4"  (1.626 m)     Head Circumference      Peak Flow      Pain Score 5     Pain Loc      Pain Edu?      Excl. in GC?     Constitutional: Alert and oriented. Pale complaining of bad cramps. Cramps is 7 out of 10. Eyes: Conjunctivae are normal. PERRL. EOMI. Head: Atraumatic. Nose: No congestion/rhinnorhea. Mouth/Throat: Mucous membranes are moist.  Oropharynx non-erythematous. Neck: No stridor.   Cardiovascular: rapid rate, regular rhythm. Grossly normal  heart sounds.  Good peripheral circulation. Respiratory: Normal respiratory effort.  No retractions. Lungs CTAB. Gastrointestinal: Soft and nontender. No distention. No abdominal bruits. No CVA tenderness. Genitourinary: see above description the history of present illness. Vagina is fullof clots with active bleeding. Musculoskeletal: No lower extremity tenderness nor edema.  No joint effusions. Neurologic:  Normal speech and language. No gross focal neurologic deficits are appreciated. No gait instability. Skin:  Skin is warm, dry and intact. No rash noted. Psychiatric: Mood and affect are normal. Speech and behavior  are normal.  ____________________________________________   LABS (all labs ordered are listed, but only abnormal results are displayed)  Labs Reviewed  CBC WITH DIFFERENTIAL/PLATELET  COMPREHENSIVE METABOLIC PANEL  TYPE AND SCREEN  ABO/RH  PREPARE RBC (CROSSMATCH)   ____________________________________________  EKG EKG read and interpreted by me shows sinus tachycardia rate of 142 normal axisSinus tachycardia  consider biatrial enlargement   ____________________________________________  RADIOLOGY   ____________________________________________   PROCEDURES  Procedure(s) performed:   Procedures  Critical Care performed:  ____________________________________________   INITIAL IMPRESSION / ASSESSMENT AND PLAN / ED COURSE  Pertinent labs & imaging results that were available during my care of the patient were reviewed by me and considered in my medical decision making (see chart for details).  Dr. Chauncey CruelStabler arrives almost immediately and assumes careblood has been ordered but not ready yet. Patient has 2 IVs infusing normal saline. She's gotten 4 mg of morphine for her severe cramps.  Clinical Course      ____________________________________________   FINAL CLINICAL IMPRESSION(S) / ED DIAGNOSES  Final diagnoses:  Vaginal bleeding in pregnancy      NEW MEDICATIONS STARTED DURING THIS VISIT:  New Prescriptions   No medications on file     Note:  This document was prepared using Dragon voice recognition software and may include unintentional dictation errors.    Arnaldo NatalPaul F Malinda, MD 11/29/15 [phone]

## 2015-11-29 NOTE — ED Notes (Signed)
This RN at bedside while EDP performed vaginal exam.  Pt having numerous clots and copious amounts of blood x1 hour today.  Pt states "I feel like I'm having labor cramps".  Pt also stated "I feel like I need to push like in labor".  Pt tachycardic upon arrival, but HR decreasing with fluids at this time.

## 2015-11-29 NOTE — H&P (Addendum)
Obstetrics & Gynecology Consult H&P    Chief Complaint: Vaginal bleeding and cramping  History of Present Illness: Patient is a 20 y.o. G2P1001 with an approximate 8 week missed abortion treated medically at her primary OB in [place] with [place] reported to her at 8 weeks (by LMP should have been around 11 weeks).  She took the medicine (cytotec) and began bleeding heavy and cramping this morning which prompted her presentation to the ED today.  Feels slightly dizzy.  No other symptoms besides cramping and bleeding.  Patient O positive per prior records  Review of Systems:10 point review of systems negative unless otherwise noted in HPI  Past Medical History:  Past Medical History:  Diagnosis Date  . Anemia   . Anemia   . Headache     Past Surgical History:  No past surgical history on file.  Gynecologic History: No history of STI or abnormal paps  Obstetric History: G2P1001 TSVD x 1  Family History:  Family History  Problem Relation Age of Onset  . Hyperlipidemia Father   . Gout Father     Social History:  Social History   Social History  . Marital status: Single    Spouse name: N/A  . Number of children: N/A  . Years of education: N/A   Occupational History  . Not on file.   Social History Main Topics  . Smoking status: Never Smoker  . Smokeless tobacco: Never Used  . Alcohol use No  . Drug use: No  . Sexual activity: Yes    Partners: Male     Comment: 2 weeks ago   Other Topics Concern  . Not on file   Social History Narrative  . No narrative on file    Allergies:  No Known Allergies  Medications: Prior to Admission medications   Medication Sig Start Date End Date Taking? Authorizing Provider  ibuprofen (ADVIL,MOTRIN) 600 MG tablet Take 1 tablet (600 mg total) by mouth every 6 (six) hours as needed. 11/24/14   Newton Pigg, MD  Prenatal Vit-Fe Fumarate-FA (PRENATAL MULTIVITAMIN) TABS tablet Take 1 tablet by mouth daily at 12 noon.    Historical  Provider, MD    Physical Exam Vitals: Blood pressure 127/69, pulse (!) 160, temperature 97.8 F (36.6 C), temperature source Oral, resp. rate 20, height '5\' 4"'$  (1.626 m), weight 150 lb (68 kg), SpO2 100 %, unknown if currently breastfeeding. General: NAD HEENT: normocephalic, anicteric Pulmonary: no increased work of breathing Cardiovascular: tachy to 120's Abdomen: Soft, non-tender, non-distended Genitourinary: Significant amount of clot burden, able to visualized the cervix, visually dilated but no POC at OS.  appoximately 6-8 week size mobile uterus.   Extremities: no edema, no erythema, no tenderness Neurologic: grossly intact Psychiatric: mood appropriate, affect full  Labs: Results for orders placed or performed during the hospital encounter of 11/29/15 (from the past 72 hour(s))  CBC with Differential     Status: Abnormal   Collection Time: 11/29/15  3:31 AM  Result Value Ref Range   WBC 15.1 (H) 3.6 - 11.0 K/uL   RBC 4.65 3.80 - 5.20 MIL/uL   Hemoglobin 13.1 12.0 - 16.0 g/dL   HCT 39.6 35.0 - 47.0 %   MCV 85.2 80.0 - 100.0 fL   MCH 28.3 26.0 - 34.0 pg   MCHC 33.2 32.0 - 36.0 g/dL   RDW 13.4 11.5 - 14.5 %   Platelets 312 150 - 440 K/uL   Neutrophils Relative % 60 %   Neutro Abs 9.2 (  H) 1.4 - 6.5 K/uL   Lymphocytes Relative 28 %   Lymphs Abs 4.2 (H) 1.0 - 3.6 K/uL   Monocytes Relative 7 %   Monocytes Absolute 1.1 (H) 0.2 - 0.9 K/uL   Eosinophils Relative 4 %   Eosinophils Absolute 0.6 0 - 0.7 K/uL   Basophils Relative 1 %   Basophils Absolute 0.1 0 - 0.1 K/uL  Comprehensive metabolic panel     Status: Abnormal   Collection Time: 11/29/15  3:31 AM  Result Value Ref Range   Sodium 137 135 - 145 mmol/L   Potassium 3.8 3.5 - 5.1 mmol/L   Chloride 106 101 - 111 mmol/L   CO2 23 22 - 32 mmol/L   Glucose, Bld 107 (H) 65 - 99 mg/dL   BUN 11 6 - 20 mg/dL   Creatinine, Ser 0.68 0.44 - 1.00 mg/dL   Calcium 8.9 8.9 - 10.3 mg/dL   Total Protein 7.5 6.5 - 8.1 g/dL   Albumin  3.7 3.5 - 5.0 g/dL   AST 27 15 - 41 U/L   ALT 14 14 - 54 U/L   Alkaline Phosphatase 78 38 - 126 U/L   Total Bilirubin 0.1 (L) 0.3 - 1.2 mg/dL   GFR calc non Af Amer >60 >60 mL/min   GFR calc Af Amer >60 >60 mL/min    Comment: (NOTE) The eGFR has been calculated using the CKD EPI equation. This calculation has not been validated in all clinical situations. eGFR's persistently <60 mL/min signify possible Chronic Kidney Disease.    Anion gap 8 5 - 15  Type and screen [facility]     Status: None (Preliminary result)   Collection Time: 11/29/15  3:31 AM  Result Value Ref Range   ABO/RH(D) PENDING    Antibody Screen PENDING    Sample Expiration 12/02/2015   Prepare RBC     Status: None (Preliminary result)   Collection Time: 11/29/15  4:30 AM  Result Value Ref Range   Order Confirmation PENDING     Imaging No results found.  Assessment: 20 y.o. G2P1001 with incomplete abortion  Plan: 1) Incomplete abortion - good starting H&H, hemodynamically stable other than tachycardia.  Given cramping and degree of bleeding proceed to OR for emergent D&C.  Rhogam not indicated O positive. - doxycyline '100mg'$  IV once on call to OR - SCDs

## 2015-11-29 NOTE — Progress Notes (Signed)
Discharge Instructions reviewed with patient. Rx's  given to patient. Patient verbalized understanding. Patient left the unit with friend and significant other. Patient aware that she cannot drive due to sedative medication given for pain. Patient reminded to take all belongings with her. VSS. Patient to leave the unit via wheelchair.

## 2015-11-29 NOTE — Discharge Summary (Addendum)
Physician Discharge Summary  Patient ID: [name] MRN: [id number] DOB/AGE: 21/12/1995 21 y.o.  Admit date: 11/29/2015 Discharge date: 11/29/2015  Admission Diagnoses: Incomplete abortion  Discharge Diagnoses:  Active Problems:   Incomplete abortion   Discharged Condition: good  Hospital Course: Patient admitted for incomplete abortion and underwent emergent suction D&C.  Postoperative H&H remained stable, and the patient was hemodynamically stable and asymptomatic throughout her postoperative course.  Patient will follow up with myself of her primary OB/GYN in the next 2 weeks.   Consults: None  Significant Diagnostic Studies:  Results for orders placed or performed during the hospital encounter of 11/29/15 (from the past 48 hour(s))  CBC with Differential     Status: Abnormal   Collection Time: 11/29/15  3:31 AM  Result Value Ref Range   WBC 15.1 (H) 3.6 - 11.0 K/uL   RBC 4.65 3.80 - 5.20 MIL/uL   Hemoglobin 13.1 12.0 - 16.0 g/dL   HCT 39.6 35.0 - 47.0 %   MCV 85.2 80.0 - 100.0 fL   MCH 28.3 26.0 - 34.0 pg   MCHC 33.2 32.0 - 36.0 g/dL   RDW 13.4 11.5 - 14.5 %   Platelets 312 150 - 440 K/uL   Neutrophils Relative % 60 %   Neutro Abs 9.2 (H) 1.4 - 6.5 K/uL   Lymphocytes Relative 28 %   Lymphs Abs 4.2 (H) 1.0 - 3.6 K/uL   Monocytes Relative 7 %   Monocytes Absolute 1.1 (H) 0.2 - 0.9 K/uL   Eosinophils Relative 4 %   Eosinophils Absolute 0.6 0 - 0.7 K/uL   Basophils Relative 1 %   Basophils Absolute 0.1 0 - 0.1 K/uL  Comprehensive metabolic panel     Status: Abnormal   Collection Time: 11/29/15  3:31 AM  Result Value Ref Range   Sodium 137 135 - 145 mmol/L   Potassium 3.8 3.5 - 5.1 mmol/L   Chloride 106 101 - 111 mmol/L   CO2 23 22 - 32 mmol/L   Glucose, Bld 107 (H) 65 - 99 mg/dL   BUN 11 6 - 20 mg/dL   Creatinine, Ser 0.68 0.44 - 1.00 mg/dL   Calcium 8.9 8.9 - 10.3 mg/dL   Total Protein 7.5 6.5 - 8.1 g/dL   Albumin 3.7 3.5 - 5.0 g/dL   AST 27 15 - 41 U/L    ALT 14 14 - 54 U/L   Alkaline Phosphatase 78 38 - 126 U/L   Total Bilirubin 0.1 (L) 0.3 - 1.2 mg/dL   GFR calc non Af Amer >60 >60 mL/min   GFR calc Af Amer >60 >60 mL/min    Comment: (NOTE) The eGFR has been calculated using the CKD EPI equation. This calculation has not been validated in all clinical situations. eGFR's persistently <60 mL/min signify possible Chronic Kidney Disease.    Anion gap 8 5 - 15  Type and screen [facility]     Status: None (Preliminary result)   Collection Time: 11/29/15  3:31 AM  Result Value Ref Range   ABO/RH(D) O POS    Antibody Screen NEG    Sample Expiration 12/02/2015    Unit Number [id number]    Blood Component Type RCLI PHER 1    Unit division 00    Status of Unit ALLOCATED    Transfusion Status OK TO TRANSFUSE    Crossmatch Result Compatible    Unit Number [id number]    Blood Component Type RBC LR PHER1  Unit division 00    Status of Unit ALLOCATED    Transfusion Status OK TO TRANSFUSE    Crossmatch Result Compatible    Unit Number [id number]    Blood Component Type RBC LR PHER2    Unit division 00    Status of Unit ALLOCATED    Transfusion Status OK TO TRANSFUSE    Crossmatch Result Compatible    Unit Number [id number]    Blood Component Type RED CELLS,LR    Unit division 00    Status of Unit ALLOCATED    Transfusion Status OK TO TRANSFUSE    Crossmatch Result Compatible   Prepare RBC     Status: None   Collection Time: 11/29/15  3:48 AM  Result Value Ref Range   Order Confirmation ORDER PROCESSED BY BLOOD BANK   ABO/Rh     Status: None   Collection Time: 11/29/15  3:51 AM  Result Value Ref Range   ABO/RH(D) O POS   CBC     Status: Abnormal   Collection Time: 11/29/15  5:42 AM  Result Value Ref Range   WBC 16.5 (H) 3.6 - 11.0 K/uL   RBC 3.25 (L) 3.80 - 5.20 MIL/uL   Hemoglobin 9.4 (L) 12.0 - 16.0 g/dL   HCT 28.2 (L) 35.0 - 47.0 %   MCV 86.8 80.0 - 100.0 fL   MCH 28.9 26.0 - 34.0 pg    MCHC 33.3 32.0 - 36.0 g/dL   RDW 13.2 11.5 - 14.5 %   Platelets 219 150 - 440 K/uL  CBC     Status: Abnormal   Collection Time: 11/29/15  9:27 AM  Result Value Ref Range   WBC 15.3 (H) 3.6 - 11.0 K/uL   RBC 3.19 (L) 3.80 - 5.20 MIL/uL   Hemoglobin 9.3 (L) 12.0 - 16.0 g/dL   HCT 27.4 (L) 35.0 - 47.0 %   MCV 85.9 80.0 - 100.0 fL   MCH 29.1 26.0 - 34.0 pg   MCHC 33.9 32.0 - 36.0 g/dL   RDW 13.5 11.5 - 14.5 %   Platelets 208 150 - 440 K/uL     Treatments: IV hydration and surgery: suction D&C  Discharge Exam: Blood pressure 100/61, pulse (!) 101, temperature 98.6 F (37 C), temperature source Oral, resp. rate 18, height _0  (1.626 m), weight 150 lb (68 kg), SpO2 99 %, unknown if currently breastfeeding. General appearance: alert, appears stated age and no distress Resp: clear to auscultation bilaterally Chest wall: no tenderness GI: soft, non-tender; bowel sounds normal; no masses,  no organomegaly Extremities: extremities normal, atraumatic, no cyanosis or edema  Disposition: 01-Home or Self Care  Discharge Instructions    Activity as tolerated    Complete by:  As directed    Call MD for:    Complete by:  As directed    Heavy vaginal bleeding   Call MD for:  difficulty breathing, headache or visual disturbances    Complete by:  As directed    Call MD for:  extreme fatigue    Complete by:  As directed    Call MD for:  hives    Complete by:  As directed    Call MD for:  persistant dizziness or light-headedness    Complete by:  As directed    Call MD for:  persistant nausea and vomiting    Complete by:  As directed    Call MD for:  severe uncontrolled pain    Complete by:  As directed  Call MD for:  temperature >100.4    Complete by:  As directed    Sexual acrtivity    Complete by:  As directed    No intercourse for 6 weeks       Medication List    TAKE these medications   ferrous sulfate 325 (65 FE) MG tablet Commonly known as:  FERROUSUL Take 1 tablet  (325 mg total) by mouth 2 (two) times daily.   ibuprofen 600 MG tablet Commonly known as:  ADVIL,MOTRIN Take 1 tablet (600 mg total) by mouth every 6 (six) hours as needed (mild pain). What changed:  reasons to take this   methylergonovine 0.2 MG tablet Commonly known as:  METHERGINE Take 1 tablet (0.2 mg total) by mouth 3 (three) times daily.   metroNIDAZOLE 500 MG tablet Commonly known as:  FLAGYL Take 1 tablet (500 mg total) by mouth 2 (two) times daily.   Norethindrone-Ethinyl Estradiol-Fe Biphas 1 MG-10 MCG / 10 MCG tablet Commonly known as:  LO LOESTRIN FE Take 1 tablet by mouth daily.   oxyCODONE-acetaminophen 5-325 MG tablet Commonly known as:  PERCOCET/ROXICET Take 1 tablet by mouth every 4 (four) hours as needed (moderate to severe pain (when tolerating fluids)).      Follow-up Information    Kailyn Dubie, Stoney Bang, MD Follow up in 2 week(s).   Specialty:  Obstetrics and Gynecology Contact information: [address] [place] [address] [phone]           Signed: Dorthula Nettles 11/29/2015, 10:31 AM

## 2015-11-29 NOTE — Anesthesia Preprocedure Evaluation (Signed)
Anesthesia Evaluation  Patient identified by MRN, date of birth, ID band Patient awake    Reviewed: Allergy & Precautions, H&P , NPO status , Patient's Chart, lab work & pertinent test results  History of Anesthesia Complications Negative for: history of anesthetic complications  Airway Mallampati: II  TM Distance: >3 FB Neck ROM: full    Dental no notable dental hx. (+) Poor Dentition, Chipped   Pulmonary neg pulmonary ROS, neg shortness of breath,    Pulmonary exam normal breath sounds clear to auscultation       Cardiovascular Exercise Tolerance: Good negative cardio ROS Normal cardiovascular exam Rhythm:regular Rate:Normal     Neuro/Psych  Headaches, negative psych ROS   GI/Hepatic negative GI ROS, Neg liver ROS, neg GERD  ,  Endo/Other  negative endocrine ROS  Renal/GU      Musculoskeletal   Abdominal   Peds  Hematology negative hematology ROS (+)   Anesthesia Other Findings Actively bleeding.    Past Medical History: No date: Anemia No date: Anemia No date: Headache  History reviewed. No pertinent surgical history.  BMI    Body Mass Index:  25.75 kg/m      Reproductive/Obstetrics negative OB ROS                             Anesthesia Physical Anesthesia Plan  ASA: IV  Anesthesia Plan: General ETT   Post-op Pain Management:    Induction:   Airway Management Planned:   Additional Equipment:   Intra-op Plan:   Post-operative Plan:   Informed Consent: I have reviewed the patients History and Physical, chart, labs and discussed the procedure including the risks, benefits and alternatives for the proposed anesthesia with the patient or authorized representative who has indicated his/her understanding and acceptance.     Plan Discussed with: Anesthesiologist, CRNA and Surgeon  Anesthesia Plan Comments:         Anesthesia Quick Evaluation

## 2015-11-30 LAB — SURGICAL PATHOLOGY

## 2015-12-04 ENCOUNTER — Encounter: Payer: Self-pay | Admitting: Obstetrics and Gynecology

## 2016-06-21 IMAGING — US US OB TRANSVAGINAL
1 series · 14 of 28 positions shown · non-contrast
Comparison: None.

CLINICAL DATA: Bleeding

EXAM:
OBSTETRIC <14 WK US AND TRANSVAGINAL OB US
TECHNIQUE: Both transabdominal and transvaginal ultrasound examinations were
performed for complete evaluation of the gestation as well as the
maternal uterus, adnexal regions, and pelvic cul-de-sac.
Transvaginal technique was performed to assess early pregnancy.

[Series 1: us ob transvaginal · 0.19mm/px · 56 acquisitions, 14 frames shown]
[im 3/56]
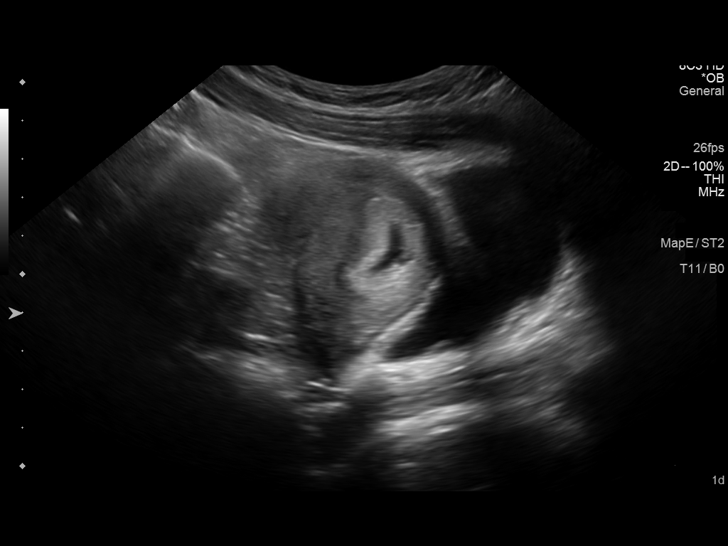
[im 7/56]
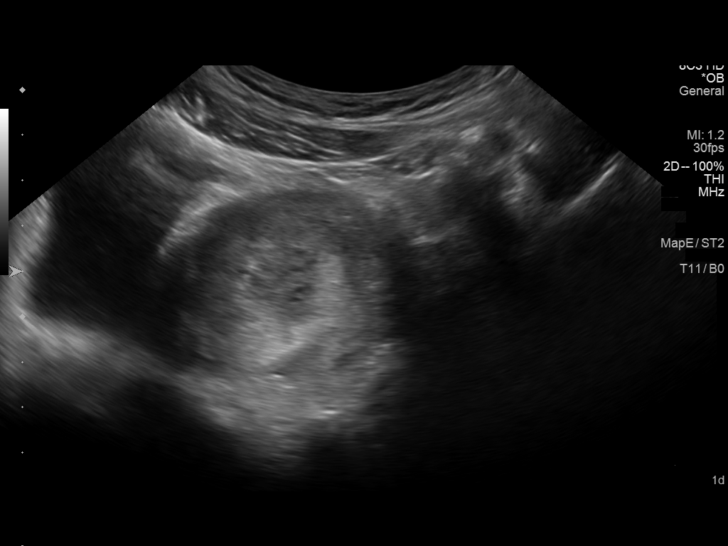
[im 11/56]
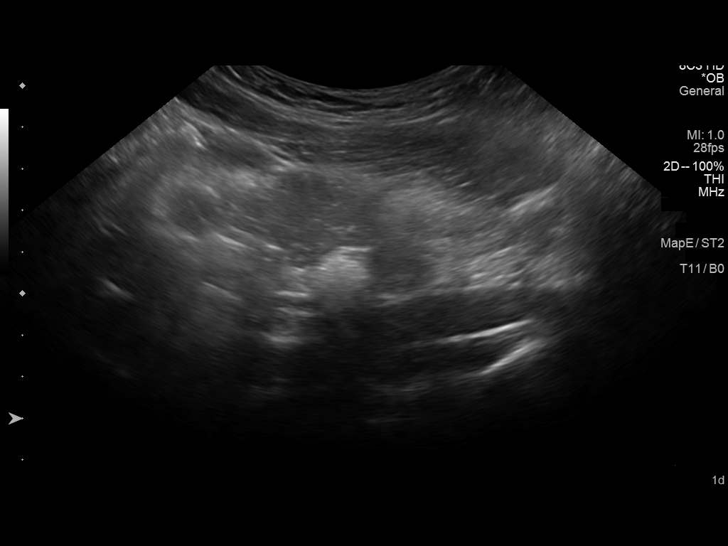
[im 15/56]
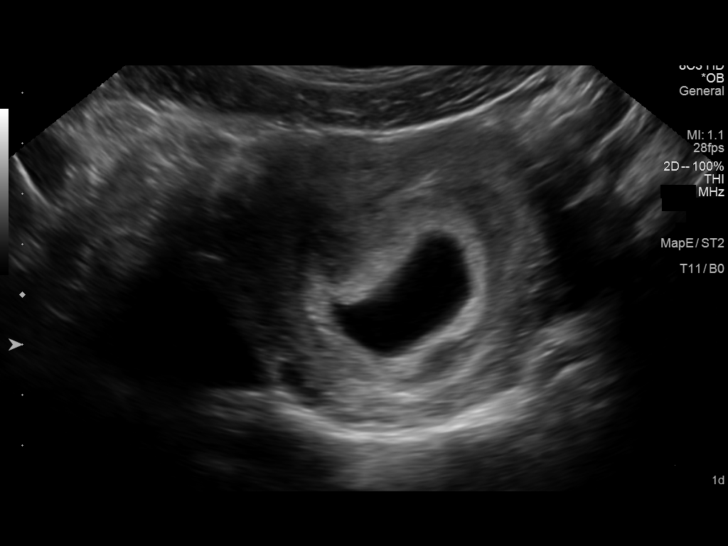
[im 19/56]
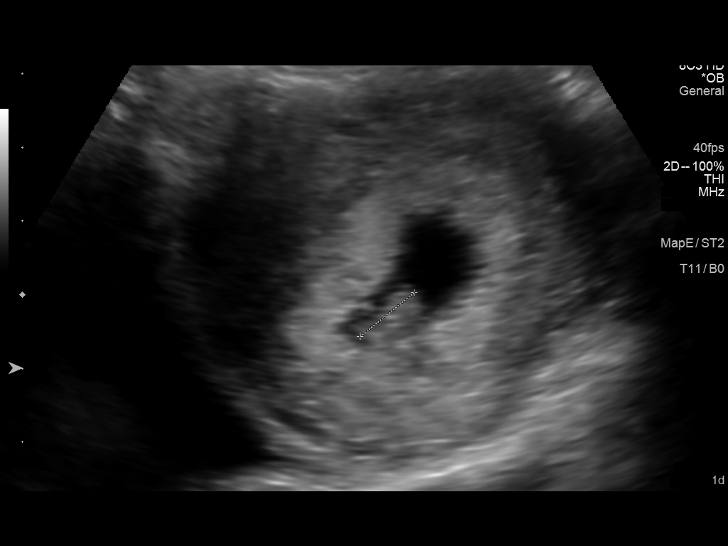
[im 23/56]
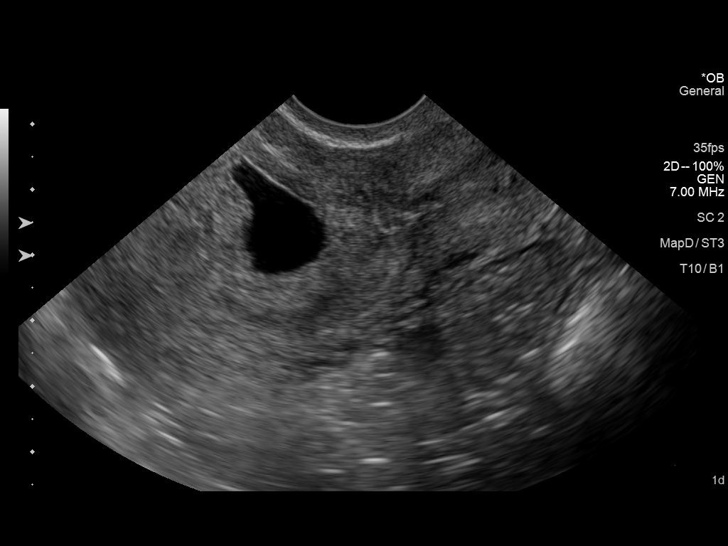
[im 27/56]
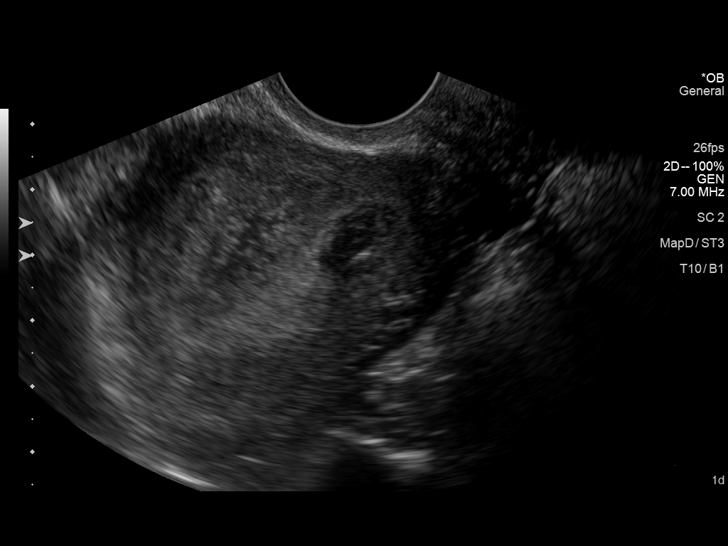
[im 31/56]
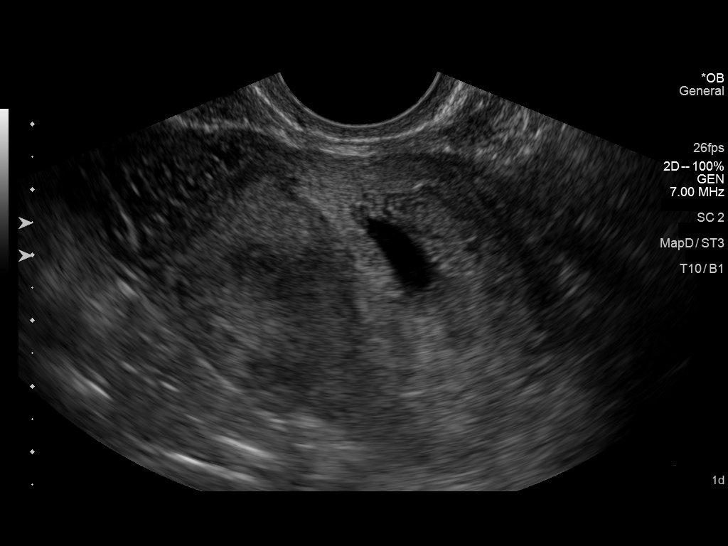
[im 35/56]
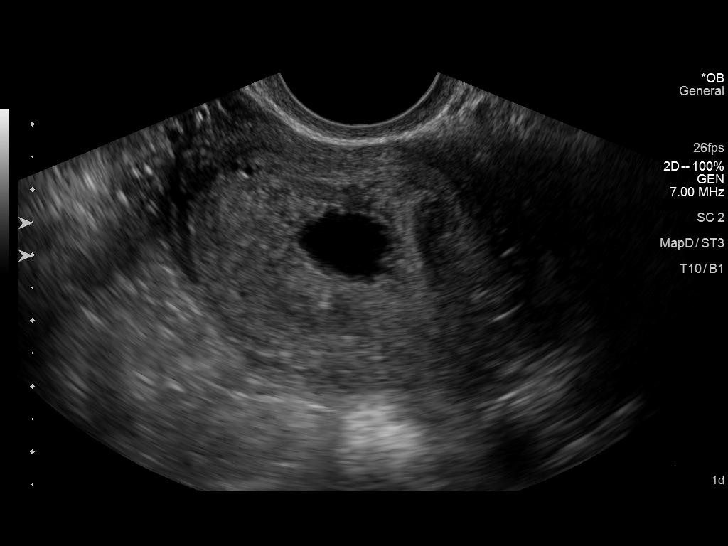
[im 39/56]
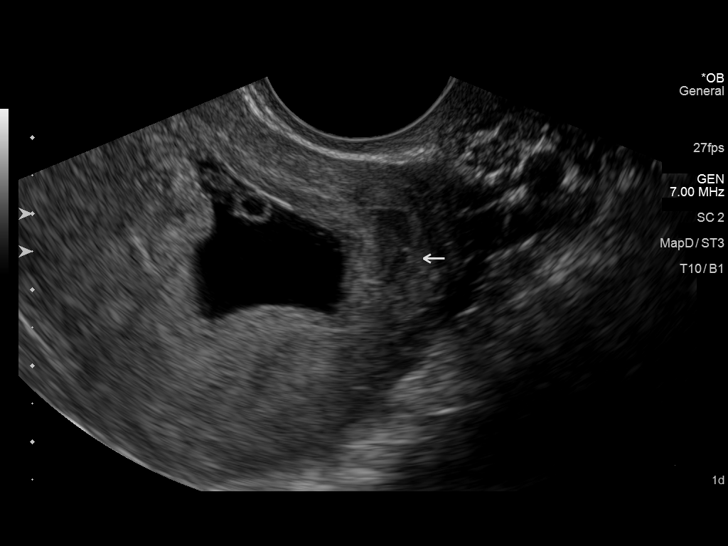
[im 43/56]
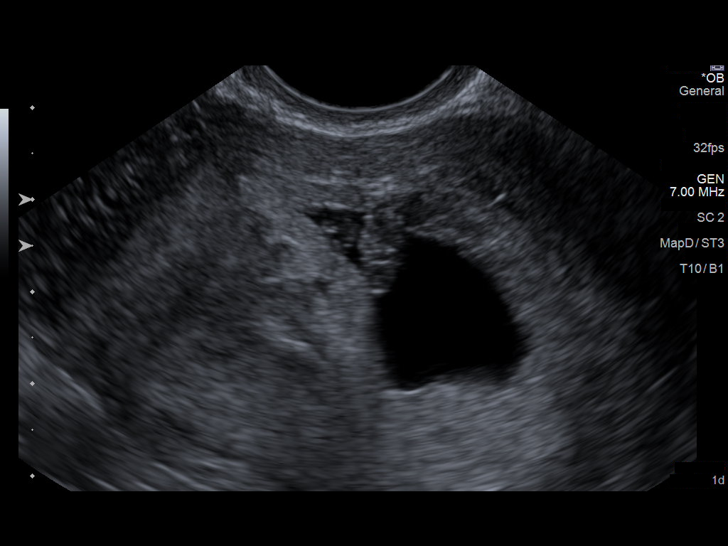
[im 47/56]
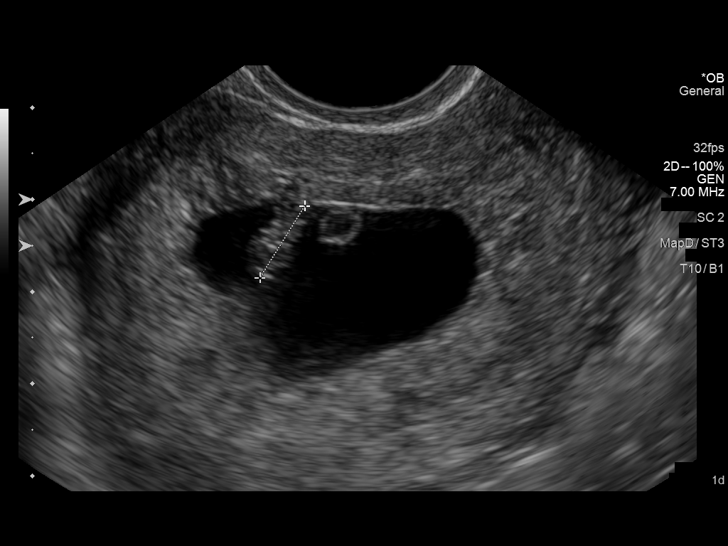
[im 51/56]
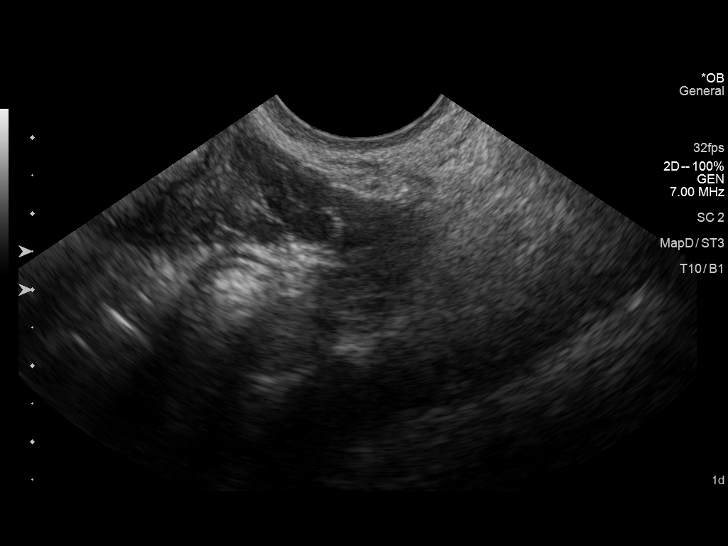
[im 56/56]
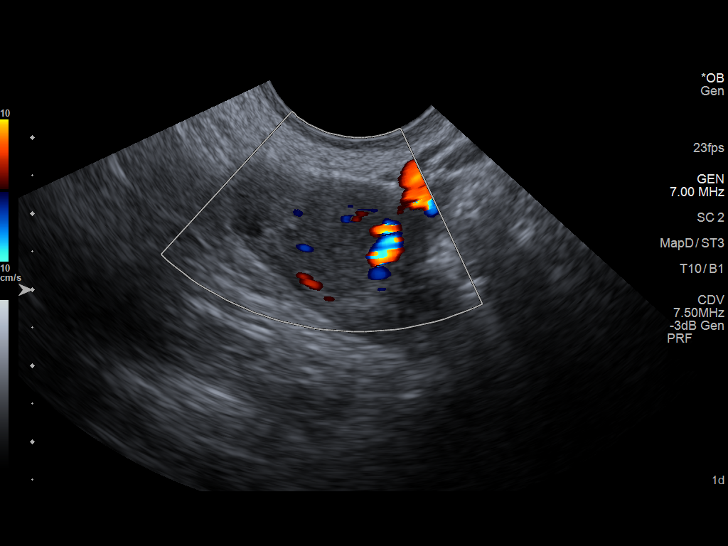

[14 of 28 positions shown; findings below may reference images not displayed]

FINDINGS: Intrauterine gestational sac: Visualized/normal in shape.

Yolk sac:  Present

Embryo:  Identified

Cardiac Activity: Identified

Heart Rate: 150  bpm

CRL:  10  mm   7 w   0 d                  US EDC: 11/25/2014

Maternal uterus/adnexae: Small subchorionic hemorrhage. Normal
sonographic appearance to the ovaries. No free fluid.
IMPRESSION: Single intrauterine gestation with cardiac activity documented.
Estimated age of 7 weeks 0 days by crown-rump length.

Small subchronic hemorrhage.

## 2017-07-18 ENCOUNTER — Emergency Department (HOSPITAL_COMMUNITY)
Admission: EM | Admit: 2017-07-18 | Discharge: 2017-07-18 | Disposition: A | Discharge status: Home / Self Care | Payer: 59 | Attending: Emergency Medicine | Admitting: Emergency Medicine

## 2017-07-18 ENCOUNTER — Encounter (HOSPITAL_COMMUNITY): Payer: Self-pay | Admitting: *Deleted

## 2017-07-18 ENCOUNTER — Other Ambulatory Visit: Payer: Self-pay

## 2017-07-18 DIAGNOSIS — R0602 Shortness of breath: Secondary | ICD-10-CM | POA: Diagnosis present

## 2017-07-18 DIAGNOSIS — Z5321 Procedure and treatment not carried out due to patient leaving prior to being seen by health care provider: Secondary | ICD-10-CM | POA: Insufficient documentation

## 2017-07-18 HISTORY — DX: Major depressive disorder, single episode, unspecified: F32.9

## 2017-07-18 HISTORY — DX: Depression, unspecified: F32.A

## 2017-07-18 NOTE — ED Notes (Signed)
Pt states to registration she is leaving and gave them her labels.

## 2017-07-18 NOTE — ED Triage Notes (Signed)
Pt started having back pain and SHOB with the pain. Pain wakes pt. Poor historian regarding causes and events for pain

## 2018-01-20 NOTE — L&D Delivery Note (Signed)
Operative Delivery Note At 6:11 PM a viable female was delivered via Vaginal, Spontaneous.  Presentation: vertex; Position: Left,, Occiput,, Anterior; Station: +3.  Delivery of the head:  First maneuver: 12/23/2018  6:09 PM, McRoberts Second maneuver: 12/23/2018  6:10 PM, Suprapubic Pressure Third maneuver: ,  12/23/18 6:10pm Posterior shoulder corkscrew Total time of dystocia 1 min 56 sec   APGAR: 7, 8; weight  pending.   Placenta status: delivered spontaneously .   Cord:  with the following complications: none .    Anesthesia:  Epidural Episiotomy: None Lacerations: None Suture Repair: n/a Est. Blood Loss (mL): 142mL  Mom to postpartum.  Baby to Couplet care / Skin to Skin. Baby moving both arms well after delivery D/w pt circumcision and they plan to do in office  Logan Bores 12/23/2018, 6:36 PM

## 2018-06-16 ENCOUNTER — Telehealth: Payer: 59 | Admitting: Family

## 2018-06-16 DIAGNOSIS — R197 Diarrhea, unspecified: Secondary | ICD-10-CM

## 2018-06-16 DIAGNOSIS — R112 Nausea with vomiting, unspecified: Secondary | ICD-10-CM

## 2018-06-16 DIAGNOSIS — Z3490 Encounter for supervision of normal pregnancy, unspecified, unspecified trimester: Secondary | ICD-10-CM

## 2018-06-16 LAB — OB RESULTS CONSOLE GC/CHLAMYDIA
Chlamydia: NEGATIVE
Gonorrhea: NEGATIVE

## 2018-06-16 LAB — OB RESULTS CONSOLE HEPATITIS B SURFACE ANTIGEN: Hepatitis B Surface Ag: NEGATIVE

## 2018-06-16 LAB — OB RESULTS CONSOLE HIV ANTIBODY (ROUTINE TESTING): HIV: NONREACTIVE

## 2018-06-16 LAB — OB RESULTS CONSOLE RPR: RPR: NONREACTIVE

## 2018-06-16 LAB — OB RESULTS CONSOLE RUBELLA ANTIBODY, IGM: Rubella: IMMUNE

## 2018-06-16 NOTE — Progress Notes (Signed)
Based on what you shared with me, I feel your condition warrants further evaluation and I recommend that you be seen for a face to face office visit.  Since you are pregnant and unable to keep liquids down you need to be seen face to face. I recommend calling your GYN office.    NOTE: If you entered your credit card information for this eVisit, you will not be charged. You may see a "hold" on your card for the $35 but that hold will drop off and you will not have a charge processed.  If you are having a true medical emergency please call 911.  If you need an urgent face to face visit, [facility] has four urgent care centers for your convenience.    PLEASE NOTE: THE [facility] LOCATIONS AND URGENT CARE CLINICS DO NOT HAVE THE TESTING FOR CORONAVIRUS COVID19 AVAILABLE.  IF YOU FEEL YOU NEED THIS TEST YOU MUST GO TO A TRIAGE LOCATION AT ONE OF THE HOSPITAL EMERGENCY DEPARTMENTS   [email or web address] to reserve your spot online an avoid wait times  [facility] [address] [place], [place] [address] Modified hours of operation: Monday-Friday, 12 PM to 6 PM  Saturday & Sunday 10 AM to 4 PM *Across the street from [employer]  [employer] [employer] (New Address!) [address] [place], [place] [address] *Just off [employer], across the road from [place]* Modified hours of operation: Monday-Friday, 12 PM to 6 PM  Closed Saturday & Sunday  [facility]'s modified hours of operation will be in effect from May 1 until May 31   The following sites will take your insurance:  . [facility]  [phone] Get Driving Directions Find a Provider at this Location  [address] [place], [place] [address] . 10 am to 8 pm Monday-Friday . 12 pm to 8 pm Saturday-Sunday   . [facility] at [facility]  [phone] Get Driving Directions Find a Provider at this Location  [address],  [address] [place], [place] [address] . 8 am to 8 pm Monday-Friday . 9 am to 6 pm Saturday . 11 am to 6 pm Sunday   . [facility] at [facility]  [phone] Get Driving Directions  [address] [place], [place] [address] . 8 am to 8 pm Monday-Friday . 8 am to 4 pm Saturday-Sunday   Your e-visit answers were reviewed by a board certified advanced clinical practitioner to complete your personal care plan.  Thank you for using e-Visits.

## 2018-07-22 ENCOUNTER — Other Ambulatory Visit: Payer: Self-pay

## 2018-07-22 ENCOUNTER — Inpatient Hospital Stay (HOSPITAL_COMMUNITY)
Admission: AD | Admit: 2018-07-22 | Discharge: 2018-07-22 | Disposition: A | Discharge status: Home / Self Care | Payer: Medicaid Other | Attending: Obstetrics and Gynecology | Admitting: Obstetrics and Gynecology

## 2018-07-22 ENCOUNTER — Encounter (HOSPITAL_COMMUNITY): Payer: Self-pay

## 2018-07-22 DIAGNOSIS — O210 Mild hyperemesis gravidarum: Secondary | ICD-10-CM | POA: Diagnosis not present

## 2018-07-22 DIAGNOSIS — R197 Diarrhea, unspecified: Secondary | ICD-10-CM | POA: Diagnosis not present

## 2018-07-22 DIAGNOSIS — O9989 Other specified diseases and conditions complicating pregnancy, childbirth and the puerperium: Secondary | ICD-10-CM | POA: Insufficient documentation

## 2018-07-22 DIAGNOSIS — O219 Vomiting of pregnancy, unspecified: Secondary | ICD-10-CM | POA: Diagnosis not present

## 2018-07-22 DIAGNOSIS — Z3A16 16 weeks gestation of pregnancy: Secondary | ICD-10-CM | POA: Insufficient documentation

## 2018-07-22 HISTORY — DX: Anxiety disorder, unspecified: F41.9

## 2018-07-22 LAB — URINALYSIS, ROUTINE W REFLEX MICROSCOPIC
Bilirubin Urine: NEGATIVE
Glucose, UA: NEGATIVE mg/dL
Hgb urine dipstick: NEGATIVE
Ketones, ur: 80 mg/dL — AB
Leukocytes,Ua: NEGATIVE
Nitrite: NEGATIVE
Protein, ur: NEGATIVE mg/dL
Specific Gravity, Urine: 1.024 (ref 1.005–1.030)
pH: 5 (ref 5.0–8.0)

## 2018-07-22 LAB — COMPREHENSIVE METABOLIC PANEL
ALT: 15 U/L (ref 0–44)
AST: 17 U/L (ref 15–41)
Albumin: 3.1 g/dL — ABNORMAL LOW (ref 3.5–5.0)
Alkaline Phosphatase: 63 U/L (ref 38–126)
Anion gap: 10 (ref 5–15)
BUN: 6 mg/dL (ref 6–20)
CO2: 21 mmol/L — ABNORMAL LOW (ref 22–32)
Calcium: 8.9 mg/dL (ref 8.9–10.3)
Chloride: 104 mmol/L (ref 98–111)
Creatinine, Ser: 0.59 mg/dL (ref 0.44–1.00)
GFR calc Af Amer: >60 mL/min (ref >60)
GFR calc non Af Amer: >60 mL/min (ref >60)
Glucose, Bld: 80 mg/dL (ref 70–99)
Potassium: 3.9 mmol/L (ref 3.5–5.1)
Sodium: 135 mmol/L (ref 135–145)
Total Bilirubin: 0.5 mg/dL (ref 0.3–1.2)
Total Protein: 6.5 g/dL (ref 6.5–8.1)

## 2018-07-22 LAB — CBC
HCT: 36.3 % (ref 36.0–46.0)
Hemoglobin: 12 g/dL (ref 12.0–15.0)
MCH: 28.5 pg (ref 26.0–34.0)
MCHC: 33.1 g/dL (ref 30.0–36.0)
MCV: 86.2 fL (ref 80.0–100.0)
Platelets: 223 10*3/uL (ref 150–400)
RBC: 4.21 MIL/uL (ref 3.87–5.11)
RDW: 13.7 % (ref 11.5–15.5)
WBC: 14.5 10*3/uL — ABNORMAL HIGH (ref 4.0–10.5)
nRBC: 0 % (ref 0.0–0.2)

## 2018-07-22 MED ORDER — LOPERAMIDE HCL 2 MG PO CAPS
4.0000 mg | ORAL_CAPSULE | Freq: Once | ORAL | Status: DC
  Filled 2018-07-22: qty 2

## 2018-07-22 MED ORDER — ONDANSETRON 8 MG PO TBDP: 8.0000 mg | ORAL_TABLET | Freq: Three times a day (TID) | ORAL | 2 refills | Status: DC | PRN

## 2018-07-22 MED ORDER — LOPERAMIDE HCL 2 MG PO CAPS: 2.0000 mg | ORAL_CAPSULE | ORAL | 0 refills | Status: DC

## 2018-07-22 MED ORDER — SODIUM CHLORIDE 0.9 % IV SOLN
8.0000 mg | Freq: Once | INTRAVENOUS | Status: AC
  Administered 2018-07-22: 8 mg via INTRAVENOUS
  Filled 2018-07-22: qty 4

## 2018-07-22 MED ORDER — DEXTROSE 5 % IN LACTATED RINGERS IV BOLUS
1000.0000 mL | Freq: Once | INTRAVENOUS | Status: AC
  Administered 2018-07-22: 1000 mL via INTRAVENOUS

## 2018-07-22 MED ORDER — PROMETHAZINE HCL 25 MG/ML IJ SOLN
25.0000 mg | Freq: Once | INTRAVENOUS | Status: DC
  Filled 2018-07-22: qty 1

## 2018-07-22 NOTE — MAU Provider Note (Signed)
Chief Complaint: Emesis and Diarrhea   First Provider Initiated Contact with Patient 07/22/18 1813       SUBJECTIVE HPI: [name] is a 23 y.o. G3P1011 at 5931w5d who presents to Maternity Admissions reporting N/V/D and wekness x 2 weeks. Discussed Sx w/ OB. Rx Diclegis helping decrease vomiting, but still has poor appetite and started vomiting again last night. Had discussed starting Zofran, but does not have Rx. States OB suggested COVID testing due to diarrhea and test was negative.   Associated signs and symptoms: Neg for fever, chills. Spouse had a few episodes of diarrhea, but nothing else. Denies any know COVID exposures.   Past Medical History:  Diagnosis Date  . Anemia   . Anemia   . Anxiety   . Depression    takes meds  . Headache    OB History  Gravida Para Term Preterm AB Living  3 1 1   1 1   SAB TAB Ectopic Multiple Live Births  1     0 1    # Outcome Date GA Lbr Len/2nd Weight Sex Delivery Anes PTL Lv  3 Current           2 Term 11/22/14 3288w4d 06:00 / 01:23 3925 g M Vag-Spont EPI, Local  LIV     Birth Comments: none  1 SAB            Past Surgical History:  Procedure Laterality Date  . DILATION AND CURETTAGE OF UTERUS N/A 11/29/2015   Procedure: DILATATION AND CURETTAGE;  Surgeon: Vena AustriaAndreas Staebler, MD;  Location: [facility] ORS;  Service: Gynecology;  Laterality: N/A;   Social History   Socioeconomic History  . Marital status: Single    Spouse name: Not on file  . Number of children: Not on file  . Years of education: Not on file  . Highest education level: Not on file  Occupational History  . Not on file  Social Needs  . Financial resource strain: Not on file  . Food insecurity    Worry: Not on file    Inability: Not on file  . Transportation needs    Medical: Not on file    Non-medical: Not on file  Tobacco Use  . Smoking status: Never Smoker  . Smokeless tobacco: Never Used  Substance and Sexual Activity  . Alcohol use: Yes    Alcohol/week:  0.0 standard drinks  . Drug use: No  . Sexual activity: Yes    Partners: Male    Birth control/protection: None  Lifestyle  . Physical activity    Days per week: Not on file    Minutes per session: Not on file  . Stress: Not on file  Relationships  . Social Musicianconnections    Talks on phone: Not on file    Gets together: Not on file    Attends religious service: Not on file    Active member of club or organization: Not on file    Attends meetings of clubs or organizations: Not on file    Relationship status: Not on file  . Intimate partner violence    Fear of current or ex partner: Not on file    Emotionally abused: Not on file    Physically abused: Not on file    Forced sexual activity: Not on file  Other Topics Concern  . Not on file  Social History Narrative  . Not on file   Family History  Problem Relation Age of Onset  . Hyperlipidemia Father   .  Gout Father    No current facility-administered medications on file prior to encounter.    Current Outpatient Medications on File Prior to Encounter  Medication Sig Dispense Refill  . ferrous sulfate (FERROUSUL) 325 (65 FE) MG tablet Take 1 tablet (325 mg total) by mouth 2 (two) times daily. 60 tablet 1  . ibuprofen (ADVIL,MOTRIN) 600 MG tablet Take 1 tablet (600 mg total) by mouth every 6 (six) hours as needed (mild pain). 30 tablet 0  . methylergonovine (METHERGINE) 0.2 MG tablet Take 1 tablet (0.2 mg total) by mouth 3 (three) times daily. 9 tablet 0  . Norethindrone-Ethinyl Estradiol-Fe Biphas (LO LOESTRIN FE) 1 MG-10 MCG / 10 MCG tablet Take 1 tablet by mouth daily. 1 Package 11  . oxyCODONE-acetaminophen (PERCOCET/ROXICET) 5-325 MG tablet Take 1 tablet by mouth every 4 (four) hours as needed (moderate to severe pain (when tolerating fluids)). 12 tablet 0   No Known Allergies  I have reviewed patient's Past Medical Hx, Surgical Hx, Family Hx, Social Hx, medications and allergies.   Review of Systems  Constitutional:  Positive for fatigue. Negative for chills and fever.  Respiratory: Negative for cough and shortness of breath.   Gastrointestinal: Positive for diarrhea, nausea and vomiting. Negative for abdominal pain and blood in stool.  Genitourinary: Negative for vaginal bleeding.  Neurological: Positive for weakness. Negative for dizziness.    OBJECTIVE Patient Vitals for the past 24 hrs:  BP Temp Temp src Pulse Resp SpO2 Height Weight  07/22/18 1941 108/64 98.2 F (36.8 C) Oral 84 17 - - -  07/22/18 1407 (!) 115/57 98.7 F (37.1 C) Oral 84 18 100 % 5\' 4"  (1.626 m) 70 kg   Constitutional: Well-developed, well-nourished female in no acute distress. Mildly ill-appearing Head: Mucus membranes moist Cardiovascular: normal rate Respiratory: normal rate and effort.  GI: Abd soft, non-tender, gravid appropriate for gestational age.  Neurologic: Alert and oriented x 4.  GU: Deferred  FHR 138 by doppler  LAB RESULTS Results for orders placed or performed during the hospital encounter of 07/22/18 (from the past 24 hour(s))  Urinalysis, Routine w reflex microscopic     Status: Abnormal   Collection Time: 07/22/18  2:35 PM  Result Value Ref Range   Color, Urine YELLOW YELLOW   APPearance HAZY (A) CLEAR   Specific Gravity, Urine 1.024 1.005 - 1.030   pH 5.0 5.0 - 8.0   Glucose, UA NEGATIVE NEGATIVE mg/dL   Hgb urine dipstick NEGATIVE NEGATIVE   Bilirubin Urine NEGATIVE NEGATIVE   Ketones, ur 80 (A) NEGATIVE mg/dL   Protein, ur NEGATIVE NEGATIVE mg/dL   Nitrite NEGATIVE NEGATIVE   Leukocytes,Ua NEGATIVE NEGATIVE  CBC     Status: Abnormal   Collection Time: 07/22/18  3:18 PM  Result Value Ref Range   WBC 14.5 (H) 4.0 - 10.5 K/uL   RBC 4.21 3.87 - 5.11 MIL/uL   Hemoglobin 12.0 12.0 - 15.0 g/dL   HCT 72.536.3 36.636.0 - 44.046.0 %   MCV 86.2 80.0 - 100.0 fL   MCH 28.5 26.0 - 34.0 pg   MCHC 33.1 30.0 - 36.0 g/dL   RDW 34.713.7 42.511.5 - 95.615.5 %   Platelets 223 150 - 400 K/uL   nRBC 0.0 0.0 - 0.2 %   Comprehensive metabolic panel     Status: Abnormal   Collection Time: 07/22/18  3:18 PM  Result Value Ref Range   Sodium 135 135 - 145 mmol/L   Potassium 3.9 3.5 - 5.1 mmol/L   Chloride  104 98 - 111 mmol/L   CO2 21 (L) 22 - 32 mmol/L   Glucose, Bld 80 70 - 99 mg/dL   BUN 6 6 - 20 mg/dL   Creatinine, Ser 0.450.59 0.44 - 1.00 mg/dL   Calcium 8.9 8.9 - 40.910.3 mg/dL   Total Protein 6.5 6.5 - 8.1 g/dL   Albumin 3.1 (L) 3.5 - 5.0 g/dL   AST 17 15 - 41 U/L   ALT 15 0 - 44 U/L   Alkaline Phosphatase 63 38 - 126 U/L   Total Bilirubin 0.5 0.3 - 1.2 mg/dL   GFR calc non Af Amer >60 >60 mL/min   GFR calc Af Amer >60 >60 mL/min   Anion gap 10 5 - 15    IMAGING No results found.  MAU COURSE Orders Placed This Encounter  Procedures  . Gastrointestinal Panel by PCR , Stool  . C Difficile Quick Screen w PCR reflex  . Urinalysis, Routine w reflex microscopic  . CBC  . Comprehensive metabolic panel  . Enteric precautions (UV disinfection) C difficile, Norovirus  . Insert peripheral IV  . Discharge patient   Meds ordered this encounter  Medications  . DISCONTD: promethazine (PHENERGAN) 25 mg in lactated ringers 1,000 mL infusion  . ondansetron (ZOFRAN) 8 mg in sodium chloride 0.9 % 50 mL IVPB  . dextrose 5% lactated ringers bolus 1,000 mL  . loperamide (IMODIUM) capsule 4 mg  . ondansetron (ZOFRAN ODT) 8 MG disintegrating tablet    Sig: Take 1 tablet (8 mg total) by mouth every 8 (eight) hours as needed for nausea or vomiting.    Dispense:  20 tablet    Refill:  2    Order Specific Question:   Supervising Provider    Answer:   CONSTANT, PEGGY [[id number]]  . loperamide (IMODIUM) 2 MG capsule    Sig: Take 1 capsule (2 mg total) by mouth as directed.    Dispense:  12 capsule    Refill:  0    Order Specific Question:   Supervising Provider    Answer:   CONSTANT, PEGGY [[id number]]   Feeling much better after IV fluids and Zofran. No vomiting and only one BM in MAU. Unable to leave stool specimen for  culture.    MDM - Uncertain if Sx are all from gastroenteritis vs N/V of pregnancy and infectious diarrhea. COVID test neg this week. N/V controlled w/ Diclegis and will add Zofran for exacerbations. Discussed need for stool culture if no improvement in the next few days. Recommend Imodium to slow down stool and Zofran may also help.   ASSESSMENT 1. Nausea and vomiting during pregnancy prior to [redacted] weeks gestation   2. Watery diarrhea     PLAN Discharge home in stable condition. Gastroenteritis precautions Recommend soap and water hand washing.  Follow-up Information    [facility]   Why: as scheduled or sooner as needed if symptoms do not improve Contact information: [address]  [address] [place] [id number] [phone]        [facility] Follow up.   Specialty: Obstetrics and Gynecology Why: as needed in pregnancy emergencies Contact information: [address] [id number] [place] [phone]         Allergies as of 07/22/2018   No Known Allergies     Medication List    STOP taking these medications   ferrous sulfate 325 (65 FE) MG tablet Commonly known as: FerrouSul  ibuprofen 600 MG tablet Commonly known as: ADVIL   methylergonovine 0.2 MG tablet Commonly known as: METHERGINE   Norethindrone-Ethinyl Estradiol-Fe Biphas 1 MG-10 MCG / 10 MCG tablet Commonly known as: Lo Loestrin Fe   oxyCODONE-acetaminophen 5-325 MG tablet Commonly known as: PERCOCET/ROXICET     TAKE these medications   loperamide 2 MG capsule Commonly known as: IMODIUM Take 1 capsule (2 mg total) by mouth as directed.   ondansetron 8 MG disintegrating tablet Commonly known as: Zofran ODT Take 1 tablet (8 mg total) by mouth every 8 (eight) hours as needed for nausea or vomiting.        Tamala Julian, [place], [place] 07/22/2018  7:32 PM

## 2018-07-22 NOTE — Discharge Instructions (Signed)
Food Choices to Help Relieve Diarrhea, Adult When you have diarrhea, the foods you eat and your eating habits are very important. Choosing the right foods and drinks can help:  Relieve diarrhea.  Replace lost fluids and nutrients.  Prevent dehydration. What general guidelines should I follow?  Relieving diarrhea  Choose foods with less than 2 g or .07 oz. of fiber per serving.  Limit fats to less than 8 tsp (38 g or 1.34 oz.) a day.  Avoid the following: ? Foods and beverages sweetened with high-fructose corn syrup, honey, or sugar alcohols such as xylitol, sorbitol, and mannitol. ? Foods that contain a lot of fat or sugar. ? Fried, greasy, or spicy foods. ? High-fiber grains, breads, and cereals. ? Raw fruits and vegetables.  Eat foods that are rich in probiotics. These foods include dairy products such as yogurt and fermented milk products. They help increase healthy bacteria in the stomach and intestines (gastrointestinal tract, or GI tract).  If you have lactose intolerance, avoid dairy products. These may make your diarrhea worse.  Take medicine to help stop diarrhea (antidiarrheal medicine) only as told by your health care provider. Replacing nutrients  Eat small meals or snacks every 3-4 hours.  Eat bland foods, such as white rice, toast, or baked potato, until your diarrhea starts to get better. Gradually reintroduce nutrient-rich foods as tolerated or as told by your health care provider. This includes: ? Well-cooked protein foods. ? Peeled, seeded, and soft-cooked fruits and vegetables. ? Low-fat dairy products.  Take vitamin and mineral supplements as told by your health care provider. Preventing dehydration  Start by sipping water or a special solution to prevent dehydration (oral rehydration solution, ORS). Urine that is clear or pale yellow means that you are getting enough fluid.  Try to drink at least 8-10 cups of fluid each day to help replace lost  fluids.  You may add other liquids in addition to water, such as clear juice or decaffeinated sports drinks, as tolerated or as told by your health care provider.  Avoid drinks with caffeine, such as coffee, tea, or soft drinks.  Avoid alcohol. What foods are recommended?     The items listed may not be a complete list. Talk with your health care provider about what dietary choices are best for you. Grains White rice. White, French, or pita breads (fresh or toasted), including plain rolls, buns, or bagels. White pasta. Saltine, soda, or graham crackers. Pretzels. Low-fiber cereal. Cooked cereals made with water (such as cornmeal, farina, or cream cereals). Plain muffins. Matzo. Melba toast. Zwieback. Vegetables Potatoes (without the skin). Most well-cooked and canned vegetables without skins or seeds. Tender lettuce. Fruits Apple sauce. Fruits canned in juice. Cooked apricots, cherries, grapefruit, peaches, pears, or plums. Fresh bananas and cantaloupe. Meats and other protein foods Baked or boiled chicken. Eggs. Tofu. Fish. Seafood. Smooth nut butters. Ground or well-cooked tender beef, ham, veal, lamb, pork, or poultry. Dairy Plain yogurt, kefir, and unsweetened liquid yogurt. Lactose-free milk, buttermilk, skim milk, or soy milk. Low-fat or nonfat hard cheese. Beverages Water. Low-calorie sports drinks. Fruit juices without pulp. Strained tomato and vegetable juices. Decaffeinated teas. Sugar-free beverages not sweetened with sugar alcohols. Oral rehydration solutions, if approved by your health care provider. Seasoning and other foods Bouillon, broth, or soups made from recommended foods. What foods are not recommended? The items listed may not be a complete list. Talk with your health care provider about what dietary choices are best for you. Grains Whole   grain, whole wheat, bran, or rye breads, rolls, pastas, and crackers. Wild or brown rice. Whole grain or bran cereals. Barley.  Oats and oatmeal. Corn tortillas or taco shells. Granola. Popcorn. Vegetables Raw vegetables. Fried vegetables. Cabbage, broccoli, Brussels sprouts, artichokes, baked beans, beet greens, corn, kale, legumes, peas, sweet potatoes, and yams. Potato skins. Cooked spinach and cabbage. Fruits Dried fruit, including raisins and dates. Raw fruits. Stewed or dried prunes. Canned fruits with syrup. Meat and other protein foods Fried or fatty meats. Deli meats. Chunky nut butters. Nuts and seeds. Beans and lentils. Bacon. Hot dogs. Sausage. Dairy High-fat cheeses. Whole milk, chocolate milk, and beverages made with milk, such as milk shakes. Half-and-half. Cream. sour cream. Ice cream. Beverages Caffeinated beverages (such as coffee, tea, soda, or energy drinks). Alcoholic beverages. Fruit juices with pulp. Prune juice. Soft drinks sweetened with high-fructose corn syrup or sugar alcohols. High-calorie sports drinks. Fats and oils Butter. Cream sauces. Margarine. Salad oils. Plain salad dressings. Olives. Avocados. Mayonnaise. Sweets and desserts Sweet rolls, doughnuts, and sweet breads. Sugar-free desserts sweetened with sugar alcohols such as xylitol and sorbitol. Seasoning and other foods Honey. Hot sauce. Chili powder. Gravy. Cream-based or milk-based soups. Pancakes and waffles. Summary  When you have diarrhea, the foods you eat and your eating habits are very important.  Make sure you get at least 8-10 cups of fluid each day, or enough to keep your urine clear or pale yellow.  Eat bland foods and gradually reintroduce healthy, nutrient-rich foods as tolerated, or as told by your health care provider.  Avoid high-fiber, fried, greasy, or spicy foods. This information is not intended to replace advice given to you by your health care provider. Make sure you discuss any questions you have with your health care provider. Document Released: 03/29/2003 Document Revised: 04/29/2018 Document Reviewed:  01/04/2016 Elsevier Patient Education  2020 [employer]  

## 2018-07-22 NOTE — Progress Notes (Signed)
Report given to Marlou Porch CNM re: pt status of denial of n/v diarrhea at this time.  CNM agree to let pt have ginger ale & saltines.  Will reevaluate after snack.

## 2018-07-22 NOTE — MAU Note (Signed)
Pt states diarrhea x 2weeks, vomiting with pregnancy; weak, disoriented x 2 weeks that has not resolved.

## 2018-07-26 ENCOUNTER — Encounter (HOSPITAL_COMMUNITY): Payer: Self-pay

## 2018-08-09 ENCOUNTER — Encounter (HOSPITAL_COMMUNITY): Payer: Self-pay | Admitting: Obstetrics and Gynecology

## 2018-10-05 ENCOUNTER — Other Ambulatory Visit (HOSPITAL_COMMUNITY): Payer: Self-pay | Admitting: Obstetrics and Gynecology

## 2018-10-27 ENCOUNTER — Ambulatory Visit (HOSPITAL_COMMUNITY): Payer: Medicaid Other | Attending: Obstetrics and Gynecology | Admitting: Genetic Counselor

## 2018-10-27 ENCOUNTER — Other Ambulatory Visit: Payer: Self-pay

## 2018-10-27 ENCOUNTER — Ambulatory Visit (HOSPITAL_COMMUNITY): Payer: Self-pay | Admitting: Genetic Counselor

## 2018-10-27 DIAGNOSIS — Z3A30 30 weeks gestation of pregnancy: Secondary | ICD-10-CM | POA: Diagnosis not present

## 2018-10-27 DIAGNOSIS — Z148 Genetic carrier of other disease: Secondary | ICD-10-CM

## 2018-10-27 DIAGNOSIS — Z315 Encounter for genetic counseling: Secondary | ICD-10-CM

## 2018-10-27 NOTE — Progress Notes (Addendum)
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 [name] came to her appointment alone due to COVID-19 visitor restrictions. However, her partner, [name], participated in our session via FaceTime.   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 Fojtik is a G3P1011, 23 y.o. female. Her current pregnancy has completed 2964w4d (Estimated Date of Delivery: 01/01/19).  [name] denied exposure to environmental toxins or chemical agents. She denied the use of alcohol, tobacco or street drugs. She denied significant viral illnesses, fevers, and bleeding during the course of her pregnancy. Her medical and surgical histories were noncontributory.  Family History  A three generation pedigree was drafted and reviewed. The family history is remarkable for the following:  - [name] has a son from a prior relationship who has delayed speech and is undergoing speech therapy. This child also reportedly has delays in learning. We discussed that many times, developmental delays and learning difficulties are multifactorial in nature, occurring due to a combination of genetic and environmental factors that are difficult to identify. Learning disabilities can appear to run in families; thus, there is a chance that the couple's other children could also experience learning difficulties of some kind. The couple understands that they should make the pediatrician aware of any concerns they have about their children's development.  - [name] had a brother who was born with "spinal dystrophy". Mr. Otelia LimesHancox did not have further information on this individual as he was adopted out at the time of birth. He was unsure of whether this individual had spina bifida, muscular  dystrophy, or a different condition altogether. We discussed that without further details, precise risk assessment is limited. However, if this brother had spinal muscular atrophy (SMA), Mr. Otelia LimesHancox could have up to a 66% risk of being a carrier for SMA. See Discussion section for more details.   The remaining family histories were reviewed and found to be noncontributory for birth defects, intellectual disability, recurrent pregnancy loss, and known genetic conditions.    The patient's ethnicity is [place]. The father of the pregnancy's ethnicity is [place], [place], [place], and Native American, specifically from [place]. Consanguinity was denied. [name] was unsure of whether or not she has Ashkenazi Jewish ancestry. Pedigree will be scanned under Media.  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 SMA is characterized by progressive muscle weakness and atrophy due to degeneration and loss of anterior horn cells (lower motor neurons) in the spinal cord and brain stem. We discussed the different types of SMA (0, I, II, and III), including differences in severity and age of onset.   Pathogenic variants in the SMN1 gene cause SMA. The number of copies of another related gene called SMN2 modifies the severity of the condition and helps determine which form of SMA an affected individual will develop. The SMN1 and SMN2 genes both provide instructions for the survival motor neuron (SMN) protein. This protein has important functions in the maintenance of motor neurons in the skeletal muscles that allow the body to move. Typically, most functional  SMN protein is produced from the SMN1 gene, with a small amount produced from the SMN2 gene. Most  individuals with SMA are missing a piece of the SMN1 gene which impairs SMN protein production. A shortage of SMN protein causes progressive motor neurons death, leading to the signs and symptoms of SMA. However, the SMN protein produced by the SMN2 gene can help make up for the protein deficiency caused by pathogenic variants in SMN1. Having multiple copies of the SMN2 gene is usually associated with less severe features and later onset of symptoms.  We reviewed that SMA is inherited in an autosomal recessive pattern. This means that both parents must be carriers for the condition to be at risk of having an affected child. We discussed that carrier testing for SMA was ordered by [name]'s OBGYN provider for her partner, [name]. However, results of partner carrier screening are still pending. Based on the carrier frequency for SMA in the Caucasian/Hispanic population, he currently has a 1 in 35 to 1 in 117 chance of being a carrier of SMA. If his biological brother was affected by SMA, [name]'s chances of being a carrier are significantly increased. If [name] is found to have 2 or more copies of SMN1 on carrier screening, his risk of being a carrier is reduced but not eliminated. If both parents are carriers of SMA, there is a 1 in 4 (25%) chance of having an affected fetus with each pregnancy. I encouraged [name] to contact me if [name] is identified as a carrier so we could have a more detailed discussion about available follow-up testing options, treatments, and options for future pregnancies.   [name]'scarrier screening was negative for cystic fibrosis, fragile X syndrome, and sickle cell disease. Thus, her risk to be a carrier for these additional conditions (listed separately in the laboratory reports) has been reduced but not eliminated. Shewas informed that select hemoglobinopathies and CF are included on [name]'s newborn screen, but that SMA currently is not  included.We discussed theEarly Checkresearch studyto add SMA to her baby's newborn screening panel. The Early Check study would facilitate early diagnosis for an infant affected by SMA. Early diagnosis allows for early initiation of treatment options, which can reduce the amount of lower motor neurons that are degenerated in an affected infant within the first several months of life. [name] indicated that she was interested in pursuing this, so she was given information on how to enroll in the Early Check study.  It is recommended that [name]. Dai inform her siblings about her SMA carrier status, as her sister has a 50% chance of being a carrier for SMA herself. If [name]. Cottam's sister is also identified to be a carrier, her partner should consider carrier screening for SMA either during the preconception period or during pregnancy to further refine the couple's risk of having a child affected by SMA.  [name]. Gillyard also had negative first trimester screen results per a note from her OBGYN provider. Negative first trimester screen results decreased the risk for Down syndrome and trisomy 18 in the pregnancy. [name]. Bardsley was reminded that while this screen reduces the likelihood of the pregnancy being affected by trisomy 34 or trisomy 53, it cannot be considered diagnostic. Diagnostic testing is available should she be interested in pursuing this.   [name]. Dory was counseled regarding diagnostic testing via amniocentesis. We discussed the technical aspects of the procedure and quoted up to a 1 in 500 (0.2%) risk  for preterm labor or other adverse pregnancy outcomes as a result of amniocentesis. Cultured cells from an amniocentesis sample allow for the visualization of a fetal karyotype, which can detect >99% of chromosomal aberrations. Chromosomal microarray can also be performed to identify smaller deletions or duplications of fetal chromosomal material. Amniocentesis could also be performed to  assess for both SMN1 and SMN2 copy number in the fetus to determine whether the fetus is affected by SMA. After careful consideration, [name]. Pat declined amniocentesis at this time. She understands that amniocentesis is available at any point after 16 weeks of pregnancy and that she may opt to undergo the procedure at a later date should she change her mind. [name]. Philipp indicated that she may considering pursuing amniocentesis if her partner were identified as a carrier for SMA.  Additional screening and diagnostic testing were declined today. She understands that screening tests, including ultrasound, cannot rule out all birth defects or genetic syndromes. The patient was advised of this limitation and states she still does not want additional testing or screening at this time. We will wait for her partner's carrier screening results to be returned before making a follow-up plan for amniocentesis versus postnatal genetic testing.  Mr. Sherrian Divers requested information about SMA that he could review. I printed out vetted information on SMA from [employer] (formerly [facility]) for Mr. Sherrian Divers and [name]. Maino to refer back to. I also provided [name]. Gilani with my business card and encouraged her and/or her partner to reach out to me if they have any further questions or concerns. If I do not hear from them by next week, I will call them to check in and see if they have received [name]'s results.  I counseled [name]. Krempasky regarding the above risks and available options. The approximate face-to-face time with the genetic counselor was 30 minutes.  In summary:  Discussed carrier screening results and options for follow-up testing  Carrier for spinal muscular atrophy  Partner carrier results pending  Reviewed negative first trimester screening results  Reduction in risk for Down syndrome and trisomy 35  Offered additional testing and screening  Declined amniocentesis at this  time  Reviewed family history concerns   Buelah Manis, [name] Genetic Counselor

## 2018-11-17 ENCOUNTER — Telehealth (HOSPITAL_COMMUNITY): Payer: Self-pay | Admitting: Genetic Counselor

## 2018-11-17 NOTE — Telephone Encounter (Signed)
I called Ms. Birnbaum to check in on the status of her partner's spinal muscular atrophy (SMA) carrier screening results. Ms. Molinaro informed me that she had received her partner's carrier screening results and they were reportedly negative. This significantly decreases their risk of having a child being affected by SMA, which Ms. Arrellano was very relieved about.   I briefly informed Ms. Zamudio about the importance of sharing her carrier screening results with her sister, as her sister could also potentially be a carrier for SMA. If her sister desires carrier screening and is identified to be an SMA carrier, her reproductive partner could then consider undergoing SMA carrier screening as well. Ms. Kass agreed to inform her sister about her results. She confirmed that she had no further questions for me at this time.   Buelah Manis, MS Genetic Counselor

## 2018-12-09 LAB — OB RESULTS CONSOLE GBS: GBS: NEGATIVE

## 2018-12-12 ENCOUNTER — Inpatient Hospital Stay (HOSPITAL_COMMUNITY)
Admission: AD | Admit: 2018-12-12 | Discharge: 2018-12-12 | Disposition: A | Discharge status: Home / Self Care | Payer: Medicaid Other | Attending: Obstetrics and Gynecology | Admitting: Obstetrics and Gynecology

## 2018-12-12 ENCOUNTER — Encounter (HOSPITAL_COMMUNITY): Payer: Self-pay

## 2018-12-12 ENCOUNTER — Other Ambulatory Visit: Payer: Self-pay

## 2018-12-12 DIAGNOSIS — Z3689 Encounter for other specified antenatal screening: Secondary | ICD-10-CM

## 2018-12-12 DIAGNOSIS — Z3A37 37 weeks gestation of pregnancy: Secondary | ICD-10-CM | POA: Diagnosis not present

## 2018-12-12 DIAGNOSIS — O26893 Other specified pregnancy related conditions, third trimester: Secondary | ICD-10-CM | POA: Diagnosis not present

## 2018-12-12 DIAGNOSIS — N898 Other specified noninflammatory disorders of vagina: Secondary | ICD-10-CM | POA: Insufficient documentation

## 2018-12-12 DIAGNOSIS — Z0371 Encounter for suspected problem with amniotic cavity and membrane ruled out: Secondary | ICD-10-CM

## 2018-12-12 LAB — URINALYSIS, ROUTINE W REFLEX MICROSCOPIC
Bilirubin Urine: NEGATIVE
Glucose, UA: NEGATIVE mg/dL
Hgb urine dipstick: NEGATIVE
Ketones, ur: 5 mg/dL — AB
Nitrite: NEGATIVE
Protein, ur: 30 mg/dL — AB
Specific Gravity, Urine: 1.02 (ref 1.005–1.030)
WBC, UA: >50 WBC/hpf — ABNORMAL HIGH (ref 0–5)
pH: 7 (ref 5.0–8.0)

## 2018-12-12 LAB — POCT FERN TEST: POCT Fern Test: NEGATIVE

## 2018-12-12 LAB — AMNISURE RUPTURE OF MEMBRANE (ROM) NOT AT ARMC: Amnisure ROM: NEGATIVE

## 2018-12-12 NOTE — MAU Note (Addendum)
Patient presents to MAU c/o ctx that started "for the past few days and they have become more intense." Patient reports Ctx q22m.  +FM  Denies vaginal bleeding Patient reports "loss of mucus plug the other day and I have been leaking since then. My undies stay wet. It feels like I'm peeing on myself." Last cervix exam on Tuesday 2cm

## 2018-12-12 NOTE — Discharge Instructions (Signed)
First Stage of Labor °Labor is your body's natural process of moving your baby and other structures, including the placenta and umbilical cord, out of your uterus. There are three stages of labor. How long each stage lasts is different for every woman. But certain events happen during each stage that are the same for everyone. °· The first stage starts when true labor begins. This stage ends when your cervix, which is the opening from your uterus into your vagina, is completely open (dilated). °· The second stage begins when your cervix is fully dilated and you start pushing. This stage ends when your baby is born. °· The third stage is the delivery of the organ that nourished your baby during pregnancy (placenta). °First stage of labor °As your due date gets closer, you may start to notice certain physical changes that mean labor is going to start soon. You may feel that your baby has dropped lower into your pelvis. You may experience irregular, often painless, contractions that go away when you walk around or lie down (Braxton Hicks contractions). This is also called false labor. °The first stage of labor begins when you start having contractions that come at regular (evenly spaced) intervals and your cervix starts to get thinner and wider in preparation for your baby to pass through. Birth care providers measure the dilation of your cervix in centimeters (cm). One centimeter is a little less than one-half of an inch. The first stage ends when your cervix is dilated to 10 cm. The first stage of labor is divided into three phases: °· Early phase. °· Active phase. °· Transitional phase. °The length of the first stage of labor varies. It may be longer if this is your first pregnancy. You may spend most of this stage at home trying to relax and stay comfortable. °How does this affect me? °During the first stage of labor, you will move through three phases. °What happens in the early phase? °· You will start to have  regular contractions that last 30-60 seconds. Contractions may come every 5-20 minutes. Keep track of your contractions and call your birth care provider. °· Your water may break during this phase. °· You may notice a clear or slightly bloody discharge of mucus (mucus plug) from your vagina. °· Your cervix will dilate to 3-6 cm. °What happens in the active phase? °The active phase usually lasts 3-5 hours. You may go to the hospital or birth center around this time. During the active phase: °· Your contractions will become stronger, longer, and more uncomfortable. °· Your contractions may last 45-90 seconds and come every 3-5 minutes. °· You may feel lower back pain. °· Your birth care providers may examine your cervix and feel your belly to find the position of your baby. °· You may have a monitor strapped to your belly to measure your contractions and your baby's heart rate. °· You may start using your pain management options. °· Your cervix may be dilated to 6 cm and may start to dilate more quickly. °What happens in the transitional phase? °The transitional phase typically lasts from 30 minutes to 2 hours. At the end of this phase, your cervix will be fully dilated to 10 cm. During the transitional phase: °· Contractions will get stronger and longer. °· Contractions may last 60-90 seconds and come less than 2 minutes apart. °· You may feel hot flashes, chills, or nausea. °How does this affect my baby? °During the first stage of labor, your baby will   gradually move down into your birth canal. °Follow these instructions at home and in the hospital or birth center: ° °· When labor first begins, try to stay calm. You are still in the early phase. If it is night, try to get some sleep. If it is day, try to relax and save your energy. You may want to make some calls and get ready to go to the hospital or birth center. °· When you are in the early phase, try these methods to help ease discomfort: °? Deep breathing and  muscle relaxation. °? Taking a walk. °? Taking a warm bath or shower. °· Drink some fluids and have a light snack if you feel like it. °· Keep track of your contractions. °· Based on the plan you created with your birth care provider, call when your contractions indicate it is time. °· If your water breaks, note the time, color, and odor of the fluid. °· When you are in the active phase, do your breathing exercises and rely on your support people and your team of birth care providers. °Contact a health care provider if: °· Your contractions are strong and regular. °· You have lower back pain or cramping. °· Your water breaks. °· You lose your mucus plug. °Get help right away if you: °· Have a severe headache that does not go away. °· Have changes in your vision. °· Have severe pain in your upper belly. °· Do not feel the baby move. °· Have bright red bleeding. °Summary °· The first stage of labor starts when true labor begins, and it ends when your cervix is dilated to 10 cm. °· The first stage of labor has three phases: early, active, and transitional. °· Your baby moves into the birth canal during the first stage of labor. °· You may have contractions that become stronger and longer. You may also lose your mucus plug and have your water break. °· Call your birth care provider when your contractions are frequent and strong enough to go to the hospital or birth center. °This information is not intended to replace advice given to you by your health care provider. Make sure you discuss any questions you have with your health care provider. °Document Released: 03/22/2017 Document Revised: 04/29/2018 Document Reviewed: 03/22/2017 °Elsevier Patient Education © 2020 [employer] ° °

## 2018-12-12 NOTE — MAU Provider Note (Signed)
S: Ms. [name] is a 23 y.o. [phone] at [redacted]w[redacted]d  who presents to MAU today complaining of leaking of fluid for 3 days. She reports frequent changing of her underwear due to moisture.  Shedenies vaginal bleeding. She endorses contractions. She reports normal fetal movement.    O: BP 131/66 (BP Location: Right Arm)   Pulse 100   Temp 98.2 F (36.8 C) (Oral)   Resp 20   Ht 5\' 4"  (1.626 m)   Wt 82.4 kg   SpO2 100%   BMI 31.19 kg/m  GENERAL: Well-developed, well-nourished female in no acute distress.  HEAD: Normocephalic, atraumatic.  CHEST: Normal effort of breathing, regular heart rate ABDOMEN: Soft, nontender, gravid PELVIC: Normal external female genitalia. Vagina is pink and rugated. Cervix with normal contour, no lesions.  Normal discharge.  Negative pooling. Some fluid vs mucous noted with valsalva maneuver. [place].  Cervical exam:  Dilation: 2 Effacement (%): 50 Exam by:: Glena Norfolk, RN   Fetal Monitoring: FHT: 135 bpm, Mod Var, -Decels, +Accels Toco: Irregular  Results for orders placed or performed during the hospital encounter of 12/12/18 (from the past 24 hour(s))  Urinalysis, Routine w reflex microscopic     Status: Abnormal   Collection Time: 12/12/18  8:16 PM  Result Value Ref Range   Color, Urine YELLOW YELLOW   APPearance CLOUDY (A) CLEAR   Specific Gravity, Urine 1.020 1.005 - 1.030   pH 7.0 5.0 - 8.0   Glucose, UA NEGATIVE NEGATIVE mg/dL   Hgb urine dipstick NEGATIVE NEGATIVE   Bilirubin Urine NEGATIVE NEGATIVE   Ketones, ur 5 (A) NEGATIVE mg/dL   Protein, ur 30 (A) NEGATIVE mg/dL   Nitrite NEGATIVE NEGATIVE   Leukocytes,Ua MODERATE (A) NEGATIVE   RBC / HPF 0-5 0 - 5 RBC/hpf   WBC, UA >50 (H) 0 - 5 WBC/hpf   Bacteria, UA RARE (A) NONE SEEN   Squamous Epithelial / LPF 11-20 0 - 5   Mucus PRESENT   Amnisure rupture of membrane (rom)not at [facility]     Status: None   Collection Time: 12/12/18  8:53 PM  Result Value Ref Range   Amnisure ROM NEGATIVE   POCT fern test     Status: Normal   Collection Time: 12/12/18  8:54 PM  Result Value Ref Range   POCT Fern Test Negative = intact amniotic membranes      A: SIUP at [redacted]w[redacted]d  Cat I FT R/O ROM  P: [name] returns inconclusive. Considering length of reported possible rupture, amnisure ordered.  Will have nurse collect. NST reactive Will reassess once results final.   [name], CNM 12/12/2018 8:39 PM  Reassessment (9:49 PM) No SROM  -Amnisure returns negative. -SVE remains the same per nurse exam. -Discharge to home with labor precautions. -Follow up with primary ob as scheduled.   Crystal Conners MSN, CNM Advanced Practice Provider, [facility]

## 2018-12-23 ENCOUNTER — Inpatient Hospital Stay (HOSPITAL_COMMUNITY): Payer: Medicaid Other | Admitting: Anesthesiology

## 2018-12-23 ENCOUNTER — Inpatient Hospital Stay (HOSPITAL_COMMUNITY)
Admission: AD | Admit: 2018-12-23 | Discharge: 2018-12-25 | DRG: 807 | Disposition: A | Discharge status: Home / Self Care | Payer: Medicaid Other | Attending: Obstetrics and Gynecology | Admitting: Obstetrics and Gynecology

## 2018-12-23 ENCOUNTER — Encounter (HOSPITAL_COMMUNITY): Payer: Self-pay | Admitting: *Deleted

## 2018-12-23 ENCOUNTER — Other Ambulatory Visit: Payer: Self-pay

## 2018-12-23 DIAGNOSIS — O4292 Full-term premature rupture of membranes, unspecified as to length of time between rupture and onset of labor: Secondary | ICD-10-CM | POA: Diagnosis present

## 2018-12-23 DIAGNOSIS — O4290 Premature rupture of membranes, unspecified as to length of time between rupture and onset of labor, unspecified weeks of gestation: Secondary | ICD-10-CM | POA: Diagnosis present

## 2018-12-23 DIAGNOSIS — Z3A38 38 weeks gestation of pregnancy: Secondary | ICD-10-CM | POA: Diagnosis not present

## 2018-12-23 DIAGNOSIS — O4202 Full-term premature rupture of membranes, onset of labor within 24 hours of rupture: Secondary | ICD-10-CM | POA: Diagnosis present

## 2018-12-23 DIAGNOSIS — Z20828 Contact with and (suspected) exposure to other viral communicable diseases: Secondary | ICD-10-CM | POA: Diagnosis present

## 2018-12-23 LAB — TYPE AND SCREEN
ABO/RH(D): O POS
Antibody Screen: NEGATIVE

## 2018-12-23 LAB — CBC
HCT: 34.3 % — ABNORMAL LOW (ref 36.0–46.0)
Hemoglobin: 10.3 g/dL — ABNORMAL LOW (ref 12.0–15.0)
MCH: 24.1 pg — ABNORMAL LOW (ref 26.0–34.0)
MCHC: 30 g/dL (ref 30.0–36.0)
MCV: 80.3 fL (ref 80.0–100.0)
Platelets: 283 10*3/uL (ref 150–400)
RBC: 4.27 MIL/uL (ref 3.87–5.11)
RDW: 15.7 % — ABNORMAL HIGH (ref 11.5–15.5)
WBC: 16.6 10*3/uL — ABNORMAL HIGH (ref 4.0–10.5)
nRBC: 0.2 % (ref 0.0–0.2)

## 2018-12-23 LAB — ABO/RH: ABO/RH(D): O POS

## 2018-12-23 LAB — SARS CORONAVIRUS 2 (TAT 6-24 HRS): SARS Coronavirus 2: NEGATIVE

## 2018-12-23 MED ORDER — DIPHENHYDRAMINE HCL 25 MG PO CAPS: 25.0000 mg | ORAL_CAPSULE | Freq: Four times a day (QID) | ORAL | Status: DC | PRN

## 2018-12-23 MED ORDER — ONDANSETRON HCL 4 MG/2ML IJ SOLN: 4.0000 mg | Freq: Four times a day (QID) | INTRAMUSCULAR | Status: DC | PRN

## 2018-12-23 MED ORDER — OXYTOCIN 40 UNITS IN NORMAL SALINE INFUSION - SIMPLE MED
1.0000 m[IU]/min | INTRAVENOUS | Status: DC
  Administered 2018-12-23: 2 m[IU]/min via INTRAVENOUS

## 2018-12-23 MED ORDER — LACTATED RINGERS IV SOLN
500.0000 mL | INTRAVENOUS | Status: DC | PRN
  Administered 2018-12-23: 500 mL via INTRAVENOUS

## 2018-12-23 MED ORDER — DIBUCAINE (PERIANAL) 1 % EX OINT: 1.0000 "application " | TOPICAL_OINTMENT | CUTANEOUS | Status: DC | PRN

## 2018-12-23 MED ORDER — OXYCODONE-ACETAMINOPHEN 5-325 MG PO TABS: 2.0000 | ORAL_TABLET | ORAL | Status: DC | PRN

## 2018-12-23 MED ORDER — SENNOSIDES-DOCUSATE SODIUM 8.6-50 MG PO TABS
2.0000 | ORAL_TABLET | ORAL | Status: DC
  Administered 2018-12-24 (×2): 2 via ORAL
  Filled 2018-12-23 (×2): qty 2

## 2018-12-23 MED ORDER — IBUPROFEN 600 MG PO TABS
600.0000 mg | ORAL_TABLET | Freq: Four times a day (QID) | ORAL | Status: DC
  Administered 2018-12-24 – 2018-12-25 (×6): 600 mg via ORAL
  Filled 2018-12-23 (×6): qty 1

## 2018-12-23 MED ORDER — LACTATED RINGERS IV SOLN
INTRAVENOUS | Status: DC
  Administered 2018-12-23 (×2): via INTRAVENOUS

## 2018-12-23 MED ORDER — ONDANSETRON HCL 4 MG/2ML IJ SOLN: 4.0000 mg | INTRAMUSCULAR | Status: DC | PRN

## 2018-12-23 MED ORDER — FENTANYL-BUPIVACAINE-NACL 0.5-0.125-0.9 MG/250ML-% EP SOLN
12.0000 mL/h | EPIDURAL | Status: DC | PRN
  Filled 2018-12-23: qty 250

## 2018-12-23 MED ORDER — ONDANSETRON HCL 4 MG PO TABS: 4.0000 mg | ORAL_TABLET | ORAL | Status: DC | PRN

## 2018-12-23 MED ORDER — LIDOCAINE HCL (PF) 1 % IJ SOLN: 30.0000 mL | INTRAMUSCULAR | Status: DC | PRN

## 2018-12-23 MED ORDER — TETANUS-DIPHTH-ACELL PERTUSSIS 5-2.5-18.5 LF-MCG/0.5 IM SUSP: 0.5000 mL | Freq: Once | INTRAMUSCULAR | Status: DC

## 2018-12-23 MED ORDER — PHENYLEPHRINE 40 MCG/ML (10ML) SYRINGE FOR IV PUSH (FOR BLOOD PRESSURE SUPPORT): 80.0000 ug | PREFILLED_SYRINGE | INTRAVENOUS | Status: DC | PRN

## 2018-12-23 MED ORDER — LACTATED RINGERS IV SOLN: 500.0000 mL | Freq: Once | INTRAVENOUS | Status: DC

## 2018-12-23 MED ORDER — WITCH HAZEL-GLYCERIN EX PADS: 1.0000 "application " | MEDICATED_PAD | CUTANEOUS | Status: DC | PRN

## 2018-12-23 MED ORDER — COCONUT OIL OIL: 1.0000 "application " | TOPICAL_OIL | Status: DC | PRN

## 2018-12-23 MED ORDER — LIDOCAINE HCL (PF) 1 % IJ SOLN
INTRAMUSCULAR | Status: DC | PRN
  Administered 2018-12-23 (×2): 6 mL via EPIDURAL

## 2018-12-23 MED ORDER — SIMETHICONE 80 MG PO CHEW: 80.0000 mg | CHEWABLE_TABLET | ORAL | Status: DC | PRN

## 2018-12-23 MED ORDER — OXYTOCIN BOLUS FROM INFUSION
500.0000 mL | Freq: Once | INTRAVENOUS | Status: AC
  Administered 2018-12-23: 500 mL via INTRAVENOUS

## 2018-12-23 MED ORDER — OXYCODONE-ACETAMINOPHEN 5-325 MG PO TABS: 1.0000 | ORAL_TABLET | ORAL | Status: DC | PRN

## 2018-12-23 MED ORDER — FENTANYL CITRATE (PF) 100 MCG/2ML IJ SOLN: 50.0000 ug | INTRAMUSCULAR | Status: DC | PRN

## 2018-12-23 MED ORDER — ACETAMINOPHEN 325 MG PO TABS: 650.0000 mg | ORAL_TABLET | ORAL | Status: DC | PRN

## 2018-12-23 MED ORDER — EPHEDRINE 5 MG/ML INJ: 10.0000 mg | INTRAVENOUS | Status: DC | PRN

## 2018-12-23 MED ORDER — TERBUTALINE SULFATE 1 MG/ML IJ SOLN: 0.2500 mg | Freq: Once | INTRAMUSCULAR | Status: DC | PRN

## 2018-12-23 MED ORDER — ZOLPIDEM TARTRATE 5 MG PO TABS: 5.0000 mg | ORAL_TABLET | Freq: Every evening | ORAL | Status: DC | PRN

## 2018-12-23 MED ORDER — SOD CITRATE-CITRIC ACID 500-334 MG/5ML PO SOLN: 30.0000 mL | ORAL | Status: DC | PRN

## 2018-12-23 MED ORDER — PRENATAL MULTIVITAMIN CH
1.0000 | ORAL_TABLET | Freq: Every day | ORAL | Status: DC
  Administered 2018-12-24: 1 via ORAL
  Filled 2018-12-23: qty 1

## 2018-12-23 MED ORDER — ACETAMINOPHEN 325 MG PO TABS
650.0000 mg | ORAL_TABLET | ORAL | Status: DC | PRN
  Administered 2018-12-23: 650 mg via ORAL
  Filled 2018-12-23: qty 2

## 2018-12-23 MED ORDER — BENZOCAINE-MENTHOL 20-0.5 % EX AERO
1.0000 "application " | INHALATION_SPRAY | CUTANEOUS | Status: DC | PRN
  Administered 2018-12-23: 1 via TOPICAL
  Filled 2018-12-23: qty 56

## 2018-12-23 MED ORDER — DIPHENHYDRAMINE HCL 50 MG/ML IJ SOLN: 12.5000 mg | INTRAMUSCULAR | Status: DC | PRN

## 2018-12-23 MED ORDER — SODIUM CHLORIDE (PF) 0.9 % IJ SOLN
INTRAMUSCULAR | Status: DC | PRN
  Administered 2018-12-23: 12 mL/h via EPIDURAL

## 2018-12-23 MED ORDER — OXYTOCIN 40 UNITS IN NORMAL SALINE INFUSION - SIMPLE MED: 2.5000 [IU]/h | INTRAVENOUS | Status: DC

## 2018-12-23 NOTE — H&P (Signed)
[name] is a 23 y.o. female G3P1001 at 66 5/7 weeks (EDD 01/01/19 by LMP c/w 10 week [place]) presenting for SROM at 62 AM.  Seen in office and confirmed grossly ruptured with +nitrazine and +fern.   Prenatal care overall uneventful.  Essential + for SMA carrier, butFOB negative.  She has a history of depression but has managed well off meds this pregnancy.  May resume prozac postpartum. She is varicella non-immune.   OB History    Gravida  3   Para  1   Term  1   Preterm      AB  1   Living  1     SAB  1   TAB      Ectopic      Multiple  0   Live Births  1         11-22-2014, 39.3 wks     M, 8lbs 10oz, NSVD 12-02-2015, 6 wks  SAB with D&C  Past Medical History:  Diagnosis Date  . Anemia   . Anemia   . Anxiety   . Depression    takes meds  . Headache    Past Surgical History:  Procedure Laterality Date  . DILATION AND CURETTAGE OF UTERUS N/A 11/29/2015   Procedure: DILATATION AND CURETTAGE;  Surgeon: Malachy Mood, MD;  Location: [facility] ORS;  Service: Gynecology;  Laterality: N/A;   Family History: family history includes Gout in her father; Hyperlipidemia in her father. Social History:  reports that she has never smoked. She has never used smokeless tobacco. She reports current alcohol use. She reports that she does not use drugs.     Maternal Diabetes: No Genetic Screening: Normal Maternal Ultrasounds/Referrals: Normal Fetal Ultrasounds or other Referrals:  None Maternal Substance Abuse:  No Significant Maternal Medications:  None Significant Maternal Lab Results:  None Other Comments:  None  Review of Systems  Constitutional: Negative for fever.  Gastrointestinal: Negative for abdominal pain.   Maternal Medical History:  Reason for admission: Rupture of membranes.   Contractions: Onset was 3-5 hours ago.   Frequency: irregular.   Perceived severity is mild.    Fetal activity: Perceived fetal activity is normal.    Prenatal  complications: Essential + for SMA carrier--FOB negative History of depression, stable off meds but plans to resume prozac pp  Prenatal Complications - Diabetes: none.      unknown if currently breastfeeding. Maternal Exam:  Uterine Assessment: Contraction strength is mild.  Contraction frequency is irregular.   Abdomen: Patient reports no abdominal tenderness. Estimated fetal weight is 8 1/2-9#.   Fetal presentation: vertex  Introitus: Normal vulva. Normal vagina.  Ferning test: positive.  Nitrazine test: positive. Amniotic fluid character: clear.  Pelvis: adequate for delivery.      Physical Exam  Constitutional: She appears well-developed.  Cardiovascular: Normal rate.  Respiratory: Effort normal.  GI: Soft.  Genitourinary:    Vulva and vagina normal.   Musculoskeletal: Normal range of motion.  Neurological: She is alert.  Psychiatric: She has a normal mood and affect.    Prenatal labs: ABO, Rh:  O positive Antibody:  negative Rubella:  Immune RPR:   NR HBsAg:   Neg HIV:   NR GBS:   Neg Hgb AA CF and Fragile X negative ONe hour GCT 81   Assessment/Plan: Pt admitted with SROM and minimal contractions.  Will augment with pitocin and allow epidural prn.  Logan Bores 12/23/2018, 12:29 PM

## 2018-12-23 NOTE — Anesthesia Procedure Notes (Signed)
Epidural Patient location during procedure: OB Start time: 12/23/2018 2:23 PM End time: 12/23/2018 2:27 PM  Staffing Anesthesiologist: Lyn Hollingshead, MD Performed: anesthesiologist   Preanesthetic Checklist Completed: patient identified, site marked, surgical consent, pre-op evaluation, timeout performed, IV checked, risks and benefits discussed and monitors and equipment checked  Epidural Patient position: sitting Prep: site prepped and draped and DuraPrep Patient monitoring: continuous pulse ox and blood pressure Approach: midline Location: L3-L4 Injection technique: LOR air  Needle:  Needle type: Tuohy  Needle gauge: 17 G Needle length: 9 cm and 9 Needle insertion depth: 7 cm Catheter type: closed end flexible Catheter size: 19 Gauge Catheter at skin depth: 12 cm Test dose: negative and Other  Assessment Events: blood not aspirated, injection not painful, no injection resistance, negative IV test and no paresthesia  Additional Notes Reason for block:procedure for pain

## 2018-12-23 NOTE — Lactation Note (Signed)
This note was copied from a baby's chart. Lactation Consultation Note Baby 4 hrs old. Mom requesting formula. Mom is breast/formula. Mom stated she was unable to latch her 1st child now 24 yrs old. Mom stated she tried NS. Mom stated she pumped for 3 months. Mom stated she is going to give formula and probably pump. LC offered mom to use DEBP or hand pump. Mom stated she would let us know. LC encouraged mom to ask her nurse if she wanted to pump.  Mom stated she had been leaking colostrum. LC encouraged mom to pump or hand express if she is leaking. Asked mom if she ever got engorged w/her 1st child, mom stated yes she she stopped pumping.  Mom has semi flat nipples/very short shaft. Everts w/stimulation, very compressible at this time. LC collected 6 ml colostrum. Encouraged mom to give BM before formula. Mom was going to give formula, LC offered to spoon feed baby colostrum first. Mom really wanted to give the formula. LC gave mom feeding amount according to hours of age. [place] doesn't feel mom wants to BF or pump. Mom stated she was probably going home tomorrow.  Baby does has a high palate and slight recessed chin.  For mom's short shaft nipples suggested shells to wear in her bra. Informed RN of consult.  Patient Name: [name] CZYSA'Y Date: 12/23/2018 Reason for consult: Initial assessment;Early term 37-38.6wks   Maternal Data Has patient been taught Hand Expression?: Yes Does the patient have breastfeeding experience prior to this delivery?: Yes  Feeding Feeding Type: Breast Milk(formula) Nipple Type: Slow - flow  LATCH Score Latch: Repeated attempts needed to sustain latch, nipple held in mouth throughout feeding, stimulation needed to elicit sucking reflex.  Audible Swallowing: None  Type of Nipple: Everted at rest and after stimulation(semi flat/very short shaft)  Comfort (Breast/Nipple): Soft / non-tender  Hold (Positioning): Full assist, staff holds infant at  breast  LATCH Score: 4  Interventions Interventions: Breast feeding basics reviewed;Support pillows;Skin to skin;Breast massage;Expressed milk;Hand express;Breast compression  Lactation Tools Discussed/Used     Consult Status Consult Status: Follow-up Date: 12/24/18 Follow-up type: In-patient    Theodoro Kalata 12/23/2018, 10:36 PM

## 2018-12-23 NOTE — Anesthesia Preprocedure Evaluation (Signed)
Anesthesia Evaluation  Patient identified by MRN, date of birth, ID band Patient awake    Reviewed: Allergy & Precautions, H&P , Patient's Chart, lab work & pertinent test results  Airway Mallampati: I  TM Distance: >3 FB Neck ROM: full    Dental no notable dental hx. (+) Teeth Intact   Pulmonary neg pulmonary ROS,    Pulmonary exam normal breath sounds clear to auscultation       Cardiovascular negative cardio ROS   Rhythm:regular Rate:Normal     Neuro/Psych  Headaches, PSYCHIATRIC DISORDERS Anxiety Depression    GI/Hepatic negative GI ROS, Neg liver ROS,   Endo/Other  negative endocrine ROS  Renal/GU negative Renal ROS  negative genitourinary   Musculoskeletal negative musculoskeletal ROS (+)   Abdominal Normal abdominal exam  (+)   Peds  Hematology  (+) Blood dyscrasia, anemia ,   Anesthesia Other Findings   Reproductive/Obstetrics (+) Pregnancy                             Anesthesia Physical Anesthesia Plan  ASA: II  Anesthesia Plan: Epidural   Post-op Pain Management:    Induction:   PONV Risk Score and Plan:   Airway Management Planned:   Additional Equipment:   Intra-op Plan:   Post-operative Plan:   Informed Consent: I have reviewed the patients History and Physical, chart, labs and discussed the procedure including the risks, benefits and alternatives for the proposed anesthesia with the patient or authorized representative who has indicated his/her understanding and acceptance.       Plan Discussed with:   Anesthesia Plan Comments:         Anesthesia Quick Evaluation

## 2018-12-23 NOTE — Consult Note (Signed)
Neonatology Note: 12/23/2018    Code Apgar paged to Room 211 for shoulder dystocia.  Delivery team arrived with infant just delivered and crying vigorously.  Team was excused by provider and nursing staff.    Audrea Muscat V.T. Lorry Anastasi, MD Neonatologist

## 2018-12-24 LAB — CBC
HCT: 30.2 % — ABNORMAL LOW (ref 36.0–46.0)
Hemoglobin: 9.4 g/dL — ABNORMAL LOW (ref 12.0–15.0)
MCH: 24.5 pg — ABNORMAL LOW (ref 26.0–34.0)
MCHC: 31.1 g/dL (ref 30.0–36.0)
MCV: 78.6 fL — ABNORMAL LOW (ref 80.0–100.0)
Platelets: 221 10*3/uL (ref 150–400)
RBC: 3.84 MIL/uL — ABNORMAL LOW (ref 3.87–5.11)
RDW: 15.7 % — ABNORMAL HIGH (ref 11.5–15.5)
WBC: 23.2 10*3/uL — ABNORMAL HIGH (ref 4.0–10.5)
nRBC: 0.1 % (ref 0.0–0.2)

## 2018-12-24 LAB — RPR: RPR Ser Ql: NONREACTIVE

## 2018-12-24 NOTE — Progress Notes (Signed)
CSW received consult due to score 17 on Edinburgh Depression Screen.    CSW spoke with [name] at bedside to address further concerns. Upon CSW entering the room, CSW congratulated [name] on the birth of infant [name]). CSW advised [name] of CSW's role and the reason for CSW coming to visit with her. Per [name] she has been dealing with a lot of different emotions over the past few weeks as [name] reported that some days she feels sad and doesn't want to shower, disagreements with partner due to chores around the house, and other causes of stress for her. [name] reported that she  has two older son's at home and that this has also been a challenge for her as she is their primary care taker. [name] reported that she is usually on Prozac but stopped her medication once she became pregnant. [name] reported that during her pregnancy she cried almost three times a week and felt no interest in showering or taking care of herself. [name] reported that when she is on her medicines, however she able to complete all task. [name] reported that she plans to restart her medications once arrived home. [name] reported that her supports are her boyfriend, sister, and dad. [name] informed CSW that she tends to become overwhelmed easily and "snap". CSW provided [name] with coping tips to reduce her depression and anger at times. [name] reports that she is not in therapy and reports that they do not have enough money for her to in therapy. CSW advised [name] that some therapist are able to see clients free of charge depending on the situation. [name] was given a list of therapy resources that she can use if desired.   CSW observed that [name] had infant lying in basinet on stomach with one receiving blanket and a thicker blanket. CSW took this time to education [name] on SIDS. CSW asked for permission to move infant onto back and to remove some blankets from him, [name] agreeable and reported that infant had been choking while on back therefore she turned him onto his stomach. [name]  reported that infant had not eaten since midnight therefore CSW suggested that [name] try and feed infant as CSW observed that infant was making feeding cues when on back. [name] also agreeable to feed infant.   CSW provided education regarding Baby Blues vs PMADs and provided [name] with resources for mental health follow up.  CSW encouraged [name] to evaluate her mental health throughout the postpartum period with the use of the New Mom Checklist developed by [facility] as well as the [place] Postnatal Depression Scale and notify a medical professional if symptoms arise.      [name], MSW, LCSW [facility] [phone]

## 2018-12-24 NOTE — Discharge Instructions (Signed)
Call office with any concerns [phone] 

## 2018-12-24 NOTE — Anesthesia Postprocedure Evaluation (Signed)
Anesthesia Post Note  Patient: [name]  Procedure(s) Performed: AN AD [place]     Patient location during evaluation: Mother Baby Anesthesia Type: Epidural Level of consciousness: awake Pain management: satisfactory to patient Vital Signs Assessment: post-procedure vital signs reviewed and stable Respiratory status: spontaneous breathing Cardiovascular status: stable Anesthetic complications: no    Last Vitals:  Vitals:   12/24/18 0129 12/24/18 0508  BP: 107/70 109/68  Pulse: 86 72  Resp: 18 16  Temp: 36.5 C 36.7 C  SpO2:      Last Pain:  Vitals:   12/24/18 0508  TempSrc: Oral  PainSc:    Pain Goal:                   Casimer Lanius

## 2018-12-24 NOTE — Progress Notes (Addendum)
Patient ID: [name], female   DOB: 25-Dec-1995, 23 y.o.   MRN: [id number] Pt doing well with no complaints. Back sore but ibuprofen helping. Lochia mild. Bonding well with baby - bottlefeeding. Denies CP/SOB or dizziness.  VSS GEN - NAD, ambulating in rroom ABD - Mild distension EXT - no edema   23.2>9.4<221  A/P: PPD#1 s/p svd with shoulder dystocia - stable          Routine pp care         Likely discharge home tomorrow

## 2018-12-25 MED ORDER — IBUPROFEN 600 MG PO TABS: 600.0000 mg | ORAL_TABLET | Freq: Four times a day (QID) | ORAL | 1 refills | Status: AC | PRN

## 2018-12-25 NOTE — Discharge Summary (Signed)
OB Discharge Summary     Patient Name: [name] DOB: 09/18/1995 MRN: [id number]  Date of admission: 12/23/2018 Delivering MD: Huel Cote   Date of discharge: 12/25/2018  Admitting diagnosis: RUPTURE Intrauterine pregnancy: [redacted]w[redacted]d     Secondary diagnosis:  Active Problems:   PROM (premature rupture of membranes)   Shoulder dystocia, delivered  Additional problems: none     Discharge diagnosis: Term Pregnancy Delivered                                                                                                Post partum procedures:none  Augmentation: Pitocin  Complications: None  Hospital course:  Induction of Labor With Vaginal Delivery   23 y.o. yo [id number] at [redacted]w[redacted]d was admitted to the hospital 12/23/2018 for induction of labor.  Indication for induction: PROM.  Patient had an uncomplicated labor course as follows: Membrane Rupture Time/Date: 10:30 AM ,12/23/2018   Intrapartum Procedures: Episiotomy: None [1]                                         Lacerations:  None [1]  Patient had delivery of a Viable infant.  Information for the patient's newborn:  [place], Awwad [[id number]]  Delivery Method: Vaginal, Spontaneous(Filed from Delivery Summary)   Shoulder dystocia  12/23/2018  Details of delivery can be found in separate delivery note.  Patient had a routine postpartum course. Patient is discharged home 12/25/18.  Physical exam  Vitals:   12/24/18 0508 12/24/18 1353 12/24/18 2202 12/25/18 0504  BP: 109/68 111/77 121/73 107/67  Pulse: 72 83 90 70  Resp: 16 18 18 18   Temp: 98 F (36.7 C) 97.9 F (36.6 C) 97.9 F (36.6 C) 97.9 F (36.6 C)  TempSrc: Oral Oral Oral Oral  SpO2:   100%   Weight:      Height:       General: alert, cooperative and no distress Lochia: appropriate Uterine Fundus: firm Incision: N/A DVT Evaluation: No evidence of DVT seen on physical exam. Labs: Lab Results  Component Value Date   WBC 23.2 (H)  12/24/2018   HGB 9.4 (L) 12/24/2018   HCT 30.2 (L) 12/24/2018   MCV 78.6 (L) 12/24/2018   PLT 221 12/24/2018   CMP Latest Ref Rng & Units 07/22/2018  Glucose 70 - 99 mg/dL 80  BUN 6 - 20 mg/dL 6  Creatinine 09/22/2018 - 4.97 mg/dL 0.26  Sodium 3.78 - 588 mmol/L 135  Potassium 3.5 - 5.1 mmol/L 3.9  Chloride 98 - 111 mmol/L 104  CO2 22 - 32 mmol/L 21(L)  Calcium 8.9 - 10.3 mg/dL 8.9  Total Protein 6.5 - 8.1 g/dL 6.5  Total Bilirubin 0.3 - 1.2 mg/dL 0.5  Alkaline Phos 38 - 126 U/L 63  AST 15 - 41 U/L 17  ALT 0 - 44 U/L 15    Discharge instruction: per After Visit Summary and "Baby and Me Booklet".  After visit meds:  Allergies as of 12/25/2018   No Known  Allergies     Medication List    STOP taking these medications   loperamide 2 MG capsule Commonly known as: IMODIUM   ondansetron 8 MG disintegrating tablet Commonly known as: Zofran ODT     TAKE these medications   calcium carbonate 500 MG chewable tablet Commonly known as: TUMS - dosed in mg elemental calcium Chew 1-2 tablets by mouth as needed for indigestion or heartburn.   ibuprofen 600 MG tablet Commonly known as: ADVIL Take 1 tablet (600 mg total) by mouth every 6 (six) hours as needed for moderate pain or cramping.   prenatal vitamin w/FE, FA 27-1 MG Tabs tablet Take 1 tablet by mouth daily at 12 noon.       Diet: routine diet  Activity: Advance as tolerated. Pelvic rest for 6 weeks.   Outpatient follow GU:[facility] circumcision within two weeks and postpartum visit in 6 weeks Follow up Appt:No future appointments. Follow up Visit:No follow-ups on file.  Postpartum contraception: Not Discussed  Newborn Data: Live born female  Birth Weight: 9 lb 1.3 oz (4120 g) APGAR: 7, 8  Newborn Delivery   Birth date/time: 12/23/2018 18:11:00 Delivery type: Vaginal, Spontaneous      Baby Feeding: Bottle Disposition:home with mother   12/25/2018 Isaiah Serge, DO

## 2018-12-25 NOTE — Plan of Care (Signed)
  Problem: Clinical Measurements: Goal: Ability to maintain clinical measurements within normal limits will improve Outcome: Completed/Met Goal: Will remain free from infection Outcome: Completed/Met Goal: Diagnostic test results will improve Outcome: Completed/Met Goal: Respiratory complications will improve Outcome: Completed/Met Goal: Cardiovascular complication will be avoided Outcome: Completed/Met   Problem: Activity: Goal: Risk for activity intolerance will decrease Outcome: Completed/Met   Problem: Nutrition: Goal: Adequate nutrition will be maintained Outcome: Completed/Met   Problem: Coping: Goal: Level of anxiety will decrease Outcome: Completed/Met   Problem: Elimination: Goal: Will not experience complications related to bowel motility Outcome: Completed/Met Goal: Will not experience complications related to urinary retention Outcome: Completed/Met   Problem: Pain Managment: Goal: General experience of comfort will improve Outcome: Completed/Met   Problem: Safety: Goal: Ability to remain free from injury will improve Outcome: Completed/Met   Problem: Skin Integrity: Goal: Risk for impaired skin integrity will decrease Outcome: Completed/Met   Problem: Education: Goal: Knowledge of condition will improve Outcome: Completed/Met   Problem: Activity: Goal: Will verbalize the importance of balancing activity with adequate rest periods Outcome: Completed/Met Goal: Ability to tolerate increased activity will improve Outcome: Completed/Met   Problem: Coping: Goal: Ability to identify and utilize available resources and services will improve Outcome: Completed/Met   Problem: Life Cycle: Goal: Chance of risk for complications during the postpartum period will decrease Outcome: Completed/Met   Problem: Role Relationship: Goal: Ability to demonstrate positive interaction with newborn will improve Outcome: Completed/Met

## 2018-12-25 NOTE — Lactation Note (Signed)
This note was copied from a baby's chart. Lactation Consultation Note Baby 71 hrs old. Mom has been exclusively formula feeding. Mom didn't want to put baby to the breast, stated she might pump. Now she isn't going pump. Discussed engorgement and management as well as how to dry her milk up. Reminded mom of [facility] OP services if needed..  Patient Name: [name] [id number] Date: 12/25/2018 Reason for consult: Follow-up assessment   Maternal Data    Feeding Feeding Type: Bottle Fed - Formula  LATCH Score                   Interventions    Lactation Tools Discussed/Used     Consult Status Consult Status: Complete Date: 12/25/18    Theodoro Kalata 12/25/2018, 4:03 AM

## 2018-12-25 NOTE — Progress Notes (Signed)
Patient ID: [name], female   DOB: 07-03-1995, 23 y.o.   MRN: [id number] Pt fatigued but well. Baby was cluster feeding last night. She reports general soreness but relieved with ibuprofen. She is ambulating, voiding and tolerating PO well. She reports lochia mild. Ready for discharge to home today VSS GEN - NAD ABD - FF EXT - no homans  23.2>9.4<221  A/P: PPD#2 s/p svd - stable         Discharge instructions reviewed

## 2018-12-25 NOTE — Plan of Care (Signed)

## 2018-12-30 ENCOUNTER — Encounter (HOSPITAL_COMMUNITY): Admission: EM | Disposition: A | Discharge status: Home / Self Care | Payer: Self-pay | Source: Home / Self Care

## 2018-12-30 ENCOUNTER — Inpatient Hospital Stay (HOSPITAL_BASED_OUTPATIENT_CLINIC_OR_DEPARTMENT_OTHER): Payer: Medicaid Other

## 2018-12-30 ENCOUNTER — Encounter (HOSPITAL_COMMUNITY): Payer: Self-pay | Admitting: Obstetrics and Gynecology

## 2018-12-30 ENCOUNTER — Other Ambulatory Visit: Payer: Self-pay

## 2018-12-30 ENCOUNTER — Inpatient Hospital Stay (HOSPITAL_COMMUNITY): Payer: Medicaid Other

## 2018-12-30 ENCOUNTER — Ambulatory Visit (HOSPITAL_COMMUNITY)
Admission: EM | Admit: 2018-12-30 | Discharge: 2018-12-31 | Disposition: A | Discharge status: Home / Self Care | Payer: Medicaid Other | Attending: Obstetrics and Gynecology | Admitting: Obstetrics and Gynecology

## 2018-12-30 ENCOUNTER — Inpatient Hospital Stay (HOSPITAL_COMMUNITY): Payer: Medicaid Other | Admitting: Certified Registered"

## 2018-12-30 DIAGNOSIS — Z20828 Contact with and (suspected) exposure to other viral communicable diseases: Secondary | ICD-10-CM | POA: Insufficient documentation

## 2018-12-30 DIAGNOSIS — N939 Abnormal uterine and vaginal bleeding, unspecified: Secondary | ICD-10-CM

## 2018-12-30 DIAGNOSIS — Z419 Encounter for procedure for purposes other than remedying health state, unspecified: Secondary | ICD-10-CM

## 2018-12-30 HISTORY — PX: OPERATIVE ULTRASOUND: SHX5996

## 2018-12-30 HISTORY — PX: DILATION AND EVACUATION: SHX1459

## 2018-12-30 LAB — POCT I-STAT, CHEM 8
BUN: 14 mg/dL (ref 6–20)
Calcium, Ion: 1.2 mmol/L (ref 1.15–1.40)
Chloride: 108 mmol/L (ref 98–111)
Creatinine, Ser: 0.5 mg/dL (ref 0.44–1.00)
Glucose, Bld: 87 mg/dL (ref 70–99)
HCT: 22 % — ABNORMAL LOW (ref 36.0–46.0)
Hemoglobin: 7.5 g/dL — ABNORMAL LOW (ref 12.0–15.0)
Potassium: 4.1 mmol/L (ref 3.5–5.1)
Sodium: 140 mmol/L (ref 135–145)
TCO2: 22 mmol/L (ref 22–32)

## 2018-12-30 LAB — CBC
HCT: 37.6 % (ref 36.0–46.0)
HCT: 23.9 % — ABNORMAL LOW (ref 36.0–46.0)
Hemoglobin: 11.2 g/dL — ABNORMAL LOW (ref 12.0–15.0)
Hemoglobin: 7.7 g/dL — ABNORMAL LOW (ref 12.0–15.0)
MCH: 24 pg — ABNORMAL LOW (ref 26.0–34.0)
MCH: 26.2 pg (ref 26.0–34.0)
MCHC: 29.8 g/dL — ABNORMAL LOW (ref 30.0–36.0)
MCHC: 32.2 g/dL (ref 30.0–36.0)
MCV: 80.7 fL (ref 80.0–100.0)
MCV: 81.3 fL (ref 80.0–100.0)
Platelets: 540 10*3/uL — ABNORMAL HIGH (ref 150–400)
Platelets: 265 10*3/uL (ref 150–400)
RBC: 4.66 MIL/uL (ref 3.87–5.11)
RBC: 2.94 MIL/uL — ABNORMAL LOW (ref 3.87–5.11)
RDW: 16 % — ABNORMAL HIGH (ref 11.5–15.5)
RDW: 16.1 % — ABNORMAL HIGH (ref 11.5–15.5)
WBC: 17.9 10*3/uL — ABNORMAL HIGH (ref 4.0–10.5)
WBC: 18.2 10*3/uL — ABNORMAL HIGH (ref 4.0–10.5)
nRBC: 0 % (ref 0.0–0.2)
nRBC: 0 % (ref 0.0–0.2)

## 2018-12-30 LAB — COMPREHENSIVE METABOLIC PANEL
ALT: 25 U/L (ref 0–44)
AST: 29 U/L (ref 15–41)
Albumin: 2.8 g/dL — ABNORMAL LOW (ref 3.5–5.0)
Alkaline Phosphatase: 144 U/L — ABNORMAL HIGH (ref 38–126)
Anion gap: 10 (ref 5–15)
BUN: 15 mg/dL (ref 6–20)
CO2: 21 mmol/L — ABNORMAL LOW (ref 22–32)
Calcium: 9 mg/dL (ref 8.9–10.3)
Chloride: 107 mmol/L (ref 98–111)
Creatinine, Ser: 0.74 mg/dL (ref 0.44–1.00)
GFR calc Af Amer: >60 mL/min (ref >60)
GFR calc non Af Amer: >60 mL/min (ref >60)
Glucose, Bld: 109 mg/dL — ABNORMAL HIGH (ref 70–99)
Potassium: 4.7 mmol/L (ref 3.5–5.1)
Sodium: 138 mmol/L (ref 135–145)
Total Bilirubin: 0.7 mg/dL (ref 0.3–1.2)
Total Protein: 6.2 g/dL — ABNORMAL LOW (ref 6.5–8.1)

## 2018-12-30 LAB — CBC WITH DIFFERENTIAL/PLATELET
Abs Immature Granulocytes: 0.24 10*3/uL — ABNORMAL HIGH (ref 0.00–0.07)
Basophils Absolute: 0.1 10*3/uL (ref 0.0–0.1)
Basophils Relative: 1 %
Eosinophils Absolute: 0.6 10*3/uL — ABNORMAL HIGH (ref 0.0–0.5)
Eosinophils Relative: 3 %
HCT: 37.2 % (ref 36.0–46.0)
Hemoglobin: 11.3 g/dL — ABNORMAL LOW (ref 12.0–15.0)
Immature Granulocytes: 1 %
Lymphocytes Relative: 20 %
Lymphs Abs: 3.5 10*3/uL (ref 0.7–4.0)
MCH: 24.3 pg — ABNORMAL LOW (ref 26.0–34.0)
MCHC: 30.4 g/dL (ref 30.0–36.0)
MCV: 80 fL (ref 80.0–100.0)
Monocytes Absolute: 1.2 10*3/uL — ABNORMAL HIGH (ref 0.1–1.0)
Monocytes Relative: 7 %
Neutro Abs: 12.1 10*3/uL — ABNORMAL HIGH (ref 1.7–7.7)
Neutrophils Relative %: 68 %
Platelets: 511 10*3/uL — ABNORMAL HIGH (ref 150–400)
RBC: 4.65 MIL/uL (ref 3.87–5.11)
RDW: 16.1 % — ABNORMAL HIGH (ref 11.5–15.5)
WBC: 17.7 10*3/uL — ABNORMAL HIGH (ref 4.0–10.5)
nRBC: 0 % (ref 0.0–0.2)

## 2018-12-30 LAB — RESPIRATORY PANEL BY RT PCR (FLU A&B, COVID)
Influenza A by PCR: NEGATIVE
Influenza B by PCR: NEGATIVE
SARS Coronavirus 2 by RT PCR: NEGATIVE

## 2018-12-30 LAB — PREPARE RBC (CROSSMATCH)

## 2018-12-30 SURGERY — DILATION AND EVACUATION, UTERUS
Anesthesia: Monitor Anesthesia Care

## 2018-12-30 MED ORDER — LACTATED RINGERS IV SOLN
INTRAVENOUS | Status: DC | PRN
  Administered 2018-12-30: 15:00:00 via INTRAVENOUS

## 2018-12-30 MED ORDER — LACTATED RINGERS IV SOLN: INTRAVENOUS | Status: DC

## 2018-12-30 MED ORDER — SILVER NITRATE-POT NITRATE 75-25 % EX MISC
CUTANEOUS | Status: AC
  Filled 2018-12-30: qty 10

## 2018-12-30 MED ORDER — OXYTOCIN 40 UNITS IN NORMAL SALINE INFUSION - SIMPLE MED
10.0000 [IU]/h | INTRAVENOUS | Status: DC
  Administered 2018-12-30: 10 [IU]/h via INTRAVENOUS
  Filled 2018-12-30: qty 1000

## 2018-12-30 MED ORDER — GABAPENTIN 100 MG PO CAPS
100.0000 mg | ORAL_CAPSULE | Freq: Two times a day (BID) | ORAL | Status: DC
  Administered 2018-12-30 – 2018-12-31 (×2): 100 mg via ORAL
  Filled 2018-12-30 (×2): qty 1

## 2018-12-30 MED ORDER — ONDANSETRON HCL 4 MG PO TABS: 4.0000 mg | ORAL_TABLET | Freq: Four times a day (QID) | ORAL | Status: DC | PRN

## 2018-12-30 MED ORDER — MIDAZOLAM HCL 2 MG/2ML IJ SOLN
INTRAMUSCULAR | Status: AC
  Filled 2018-12-30: qty 2

## 2018-12-30 MED ORDER — PHENYLEPHRINE 40 MCG/ML (10ML) SYRINGE FOR IV PUSH (FOR BLOOD PRESSURE SUPPORT)
PREFILLED_SYRINGE | INTRAVENOUS | Status: AC
  Filled 2018-12-30: qty 50

## 2018-12-30 MED ORDER — MISOPROSTOL 200 MCG PO TABS
ORAL_TABLET | ORAL | Status: AC
  Filled 2018-12-30: qty 3

## 2018-12-30 MED ORDER — LIDOCAINE 2% (20 MG/ML) 5 ML SYRINGE
INTRAMUSCULAR | Status: AC
  Filled 2018-12-30: qty 5

## 2018-12-30 MED ORDER — SODIUM CHLORIDE 0.9 % IV SOLN: 1.5000 g | INTRAVENOUS | Status: DC

## 2018-12-30 MED ORDER — METHYLERGONOVINE MALEATE 0.2 MG/ML IJ SOLN
INTRAMUSCULAR | Status: AC
  Administered 2018-12-30: 0.2 mg via INTRAMUSCULAR
  Filled 2018-12-30: qty 1

## 2018-12-30 MED ORDER — 0.9 % SODIUM CHLORIDE (POUR BTL) OPTIME
TOPICAL | Status: DC | PRN
  Administered 2018-12-30: 14:00:00 1000 mL

## 2018-12-30 MED ORDER — TRANEXAMIC ACID-NACL 1000-0.7 MG/100ML-% IV SOLN
1000.0000 mg | INTRAVENOUS | Status: DC
  Filled 2018-12-30: qty 100

## 2018-12-30 MED ORDER — IBUPROFEN 600 MG PO TABS
600.0000 mg | ORAL_TABLET | Freq: Four times a day (QID) | ORAL | Status: DC | PRN
  Administered 2018-12-31: 600 mg via ORAL
  Filled 2018-12-30 (×2): qty 1

## 2018-12-30 MED ORDER — MIDAZOLAM HCL 5 MG/5ML IJ SOLN
INTRAMUSCULAR | Status: DC | PRN
  Administered 2018-12-30: 2 mg via INTRAVENOUS

## 2018-12-30 MED ORDER — KETOROLAC TROMETHAMINE 30 MG/ML IJ SOLN
INTRAMUSCULAR | Status: AC
  Filled 2018-12-30: qty 1

## 2018-12-30 MED ORDER — PROPOFOL 500 MG/50ML IV EMUL
INTRAVENOUS | Status: DC | PRN
  Administered 2018-12-30: 200 ug/kg/min via INTRAVENOUS

## 2018-12-30 MED ORDER — KETOROLAC TROMETHAMINE 30 MG/ML IJ SOLN
INTRAMUSCULAR | Status: DC | PRN
  Administered 2018-12-30: 30 mg via INTRAVENOUS

## 2018-12-30 MED ORDER — ALBUMIN HUMAN 5 % IV SOLN
INTRAVENOUS | Status: DC | PRN
  Administered 2018-12-30 (×2): via INTRAVENOUS

## 2018-12-30 MED ORDER — SODIUM CHLORIDE 0.9 % IV SOLN
INTRAVENOUS | Status: DC | PRN
  Administered 2018-12-30: 14:00:00 via INTRAVENOUS

## 2018-12-30 MED ORDER — SODIUM CHLORIDE 0.9 % IV SOLN
1.5000 g | Freq: Once | INTRAVENOUS | Status: AC
  Administered 2018-12-30: 1.5 g via INTRAVENOUS
  Filled 2018-12-30: qty 4

## 2018-12-30 MED ORDER — LACTATED RINGERS IV BOLUS
1000.0000 mL | Freq: Once | INTRAVENOUS | Status: AC
  Administered 2018-12-30: 1000 mL via INTRAVENOUS

## 2018-12-30 MED ORDER — ONDANSETRON HCL 4 MG/2ML IJ SOLN
INTRAMUSCULAR | Status: DC | PRN
  Administered 2018-12-30: 4 mg via INTRAVENOUS

## 2018-12-30 MED ORDER — ONDANSETRON HCL 4 MG/2ML IJ SOLN
INTRAMUSCULAR | Status: AC
  Filled 2018-12-30: qty 2

## 2018-12-30 MED ORDER — ONDANSETRON HCL 4 MG/2ML IJ SOLN: 4.0000 mg | Freq: Four times a day (QID) | INTRAMUSCULAR | Status: DC | PRN

## 2018-12-30 MED ORDER — SODIUM CHLORIDE 0.9% IV SOLUTION
Freq: Once | INTRAVENOUS | Status: AC
  Administered 2018-12-30: 12:00:00 via INTRAVENOUS

## 2018-12-30 MED ORDER — OXYCODONE HCL 5 MG PO TABS: 5.0000 mg | ORAL_TABLET | ORAL | Status: DC | PRN

## 2018-12-30 MED ORDER — FENTANYL CITRATE (PF) 100 MCG/2ML IJ SOLN
INTRAMUSCULAR | Status: DC | PRN
  Administered 2018-12-30: 50 ug via INTRAVENOUS
  Administered 2018-12-30: 25 ug via INTRAVENOUS

## 2018-12-30 MED ORDER — ACETAMINOPHEN 500 MG PO TABS: 1000.0000 mg | ORAL_TABLET | ORAL | Status: DC

## 2018-12-30 MED ORDER — FENTANYL CITRATE (PF) 250 MCG/5ML IJ SOLN
INTRAMUSCULAR | Status: AC
  Filled 2018-12-30: qty 5

## 2018-12-30 MED ORDER — METHYLERGONOVINE MALEATE 0.2 MG/ML IJ SOLN: 0.2000 mg | Freq: Once | INTRAMUSCULAR | Status: AC

## 2018-12-30 MED ORDER — MISOPROSTOL 200 MCG PO TABS
ORAL_TABLET | ORAL | Status: AC
  Filled 2018-12-30: qty 1

## 2018-12-30 MED ORDER — PHENYLEPHRINE 40 MCG/ML (10ML) SYRINGE FOR IV PUSH (FOR BLOOD PRESSURE SUPPORT)
PREFILLED_SYRINGE | INTRAVENOUS | Status: DC | PRN
  Administered 2018-12-30 (×2): 80 ug via INTRAVENOUS
  Administered 2018-12-30 (×3): 120 ug via INTRAVENOUS
  Administered 2018-12-30: 80 ug via INTRAVENOUS

## 2018-12-30 MED ORDER — TRANEXAMIC ACID-NACL 1000-0.7 MG/100ML-% IV SOLN
1000.0000 mg | Freq: Once | INTRAVENOUS | Status: AC
  Administered 2018-12-30: 1000 mg via INTRAVENOUS
  Filled 2018-12-30 (×2): qty 100

## 2018-12-30 MED ORDER — PROPOFOL 10 MG/ML IV BOLUS
INTRAVENOUS | Status: DC | PRN
  Administered 2018-12-30 (×3): 50 mg via INTRAVENOUS

## 2018-12-30 MED ORDER — LACTATED RINGERS IV SOLN
INTRAVENOUS | Status: DC
  Administered 2018-12-30: 17:00:00 via INTRAVENOUS

## 2018-12-30 MED ORDER — LIDOCAINE HCL 1 % IJ SOLN
INTRAMUSCULAR | Status: AC
  Filled 2018-12-30: qty 20

## 2018-12-30 MED ORDER — PROPOFOL 10 MG/ML IV BOLUS
INTRAVENOUS | Status: AC
  Filled 2018-12-30: qty 20

## 2018-12-30 MED ORDER — SIMETHICONE 80 MG PO CHEW: 80.0000 mg | CHEWABLE_TABLET | Freq: Four times a day (QID) | ORAL | Status: DC | PRN

## 2018-12-30 MED ORDER — MISOPROSTOL 100 MCG PO TABS
ORAL_TABLET | ORAL | Status: DC | PRN
  Administered 2018-12-30: 800 ug

## 2018-12-30 SURGICAL SUPPLY — 15 items
CATH ROBINSON RED A/P 16FR (CATHETERS) ×2 IMPLANT
FILTER UTR ASPR ASSEMBLY (MISCELLANEOUS) ×2 IMPLANT
GLOVE BIO SURGEON STRL SZ 6.5 (GLOVE) ×7 IMPLANT
GLOVE BIOGEL PI IND STRL 7.0 (GLOVE) ×1 IMPLANT
GLOVE BIOGEL PI INDICATOR 7.0 (GLOVE) ×1
GOWN STRL REUS W/ TWL LRG LVL3 (GOWN DISPOSABLE) ×2 IMPLANT
GOWN STRL REUS W/TWL LRG LVL3 (GOWN DISPOSABLE) ×4
HOSE CONNECTING 18IN BERKELEY (TUBING) ×2 IMPLANT
KIT BERKELEY 1ST TRIMESTER 3/8 (MISCELLANEOUS) ×4 IMPLANT
PACK VAGINAL MINOR WOMEN LF (CUSTOM PROCEDURE TRAY) ×2 IMPLANT
PAD OB MATERNITY 4.3X12.25 (PERSONAL CARE ITEMS) ×2 IMPLANT
SET BERKELEY SUCTION TUBING (SUCTIONS) ×2 IMPLANT
TOWEL GREEN STERILE FF (TOWEL DISPOSABLE) ×4 IMPLANT
UNDERPAD 30X36 HEAVY ABSORB (UNDERPADS AND DIAPERS) ×2 IMPLANT
VACURETTE 10 RIGID CVD (CANNULA) ×1 IMPLANT

## 2018-12-30 NOTE — Plan of Care (Signed)
  Problem: Education: Goal: Knowledge of the prescribed therapeutic regimen will improve Outcome: Progressing   Problem: Self-Concept: Goal: Communication of feelings regarding changes in body function or appearance will improve Outcome: Progressing   Problem: Skin Integrity: Goal: Demonstration of wound healing without infection will improve Outcome: Progressing

## 2018-12-30 NOTE — Plan of Care (Signed)
  Problem: Education: Goal: Knowledge of the prescribed therapeutic regimen will improve 12/30/2018 2017 by Lars Masson, RN Outcome: Progressing 12/30/2018 2017 by Lars Masson, RN Outcome: Progressing   Problem: Self-Concept: Goal: Communication of feelings regarding changes in body function or appearance will improve Outcome: Progressing   Problem: Skin Integrity: Goal: Demonstration of wound healing without infection will improve Outcome: Progressing   Problem: Education: Goal: Knowledge of General Education information will improve Description: Including pain rating scale, medication(s)/side effects and non-pharmacologic comfort measures Outcome: Progressing   Problem: Clinical Measurements: Goal: Ability to maintain clinical measurements within normal limits will improve Outcome: Progressing Goal: Will remain free from infection Outcome: Progressing

## 2018-12-30 NOTE — Op Note (Signed)
Operative Note    Preoperative Diagnosis: Postpartum hemorrhage                                             Retained products of conception   Postoperative Diagnosis: Same   Procedure: Suction dilation and evacuation of products of conception under ultrasound guidance   Surgeon: Mickle Mallory DO Anesthesia: MAC  Fluids: LR 2L EBL: 1L UOP: 188m  Albumen x 2  Findings: Uterus sounded to 11cm, retained products of conception.    Specimen: products of conception to pathology   Procedure Note Consent was verified with pt preoperatively. Pt had come in in bleeding a week after svd with shoulder dystocia. Bleeding stabilized in MAU but bedside UKoreashowed retained products of conception. Pt consented for surgery under ultrasound guidance.  Patient was taken to the operating room where MAC anesthesia was administered and found to be adequate.  She was then prepped and draped in the normal sterile fashion in the dorsal lithotomy position. An appropriate time out was performed. An exam under anesthesia noted an a firm but enlarged anteverted uterus.  A speculum was then placed within the vagina and the anterior lip of the cervix identified and grasped with a single toothed tenaculum. Uterus was then sounded to 11cm.  The cervix was open. A size 10 curved cannula was then inserted into the uterine cavity and suction begun. There was copious amount of products evacuated. After several passes, a sharp curettage was then performed. This process was alternated 6 more times till uterus visually ( on ultrasound) and manually noted to contract. Significantly reduced amount of products were now noted. A final currettage noted a gritty texture in all four quadrants.  Hence all instruments were removed from the vagina.  The tenaculum site was noted to be hemostatic.  There was scant to no bleeding from cervix. The speculum was removed from the vagina. Pitocin was given IV and 80109m rectal cytotec placed. Pt was  given albumen and fluids to maintain blood pressure. Finally the patient was awakened and taken to the recovery room in good condition.  Counts correct per nursing staff

## 2018-12-30 NOTE — MAU Note (Signed)
Pt presents to MAU from ED due to heavy vaginal bleeding that started today. She delivered vaginally on 54/4, complication due to shoulder dystocia. She describes bleeding as bright red with large clots (baseball size). She has had cramping with bleeding.

## 2018-12-30 NOTE — Anesthesia Preprocedure Evaluation (Signed)
Anesthesia Evaluation  Patient identified by MRN, date of birth, ID band Patient awake    Reviewed: Allergy & Precautions, NPO status , Patient's Chart, lab work & pertinent test results  Airway Mallampati: I  TM Distance: >3 FB Neck ROM: Full    Dental   Pulmonary    Pulmonary exam normal        Cardiovascular Normal cardiovascular exam     Neuro/Psych Anxiety Depression    GI/Hepatic   Endo/Other    Renal/GU      Musculoskeletal   Abdominal   Peds  Hematology   Anesthesia Other Findings   Reproductive/Obstetrics                             Anesthesia Physical Anesthesia Plan  ASA: II and emergent  Anesthesia Plan: MAC   Post-op Pain Management:    Induction: Intravenous  PONV Risk Score and Plan: 2 and Treatment may vary due to age or medical condition  Airway Management Planned: Simple Face Mask  Additional Equipment:   Intra-op Plan:   Post-operative Plan:   Informed Consent: I have reviewed the patients History and Physical, chart, labs and discussed the procedure including the risks, benefits and alternatives for the proposed anesthesia with the patient or authorized representative who has indicated his/her understanding and acceptance.       Plan Discussed with: CRNA and Surgeon  Anesthesia Plan Comments:         Anesthesia Quick Evaluation

## 2018-12-30 NOTE — H&P (Addendum)
[name] is an 22 y.[id number] female on postpartum day 7 from a svd complicated by shoulder dystocia. Pt presented to MAU with complaint of vaginal bleeding that started at 0430 this am and progressively worsened. She was bleeding, tachycardic and hypotensive in MAU but symptoms have stabilized after received methergine, txa and fluids. Bedside US showed retained products of conception.   Pertinent Gynecological History: Bleeding: heavy vaginal bleeding - now stable DES exposure: denies Blood transfusions: none Sexually transmitted diseases: no past history Previous GYN Procedures: [facility] 2017 Last mammogram: n/a Date:  Last pap: normal Date: 02/2016 OB History: G3, P2012   Menstrual History: Menarche age: No LMP recorded.    Past Medical History:  Diagnosis Date  . Anemia   . Anemia   . Anxiety   . Depression    takes meds  . Headache     Past Surgical History:  Procedure Laterality Date  . DILATION AND CURETTAGE OF UTERUS N/A 11/29/2015   Procedure: DILATATION AND CURETTAGE;  Surgeon: Vena Austria, MD;  Location: [facility] ORS;  Service: Gynecology;  Laterality: N/A;    Family History  Problem Relation Age of Onset  . Hyperlipidemia Father   . Gout Father     Social History:  reports that she has never smoked. She has never used smokeless tobacco. She reports current alcohol use. She reports that she does not use drugs.  Allergies: No Known Allergies  Medications Prior to Admission  Medication Sig Dispense Refill Last Dose  . calcium carbonate (TUMS - DOSED IN MG ELEMENTAL CALCIUM) 500 MG chewable tablet Chew 1-2 tablets by mouth as needed for indigestion or heartburn.     Marland Kitchen ibuprofen (ADVIL) 600 MG tablet Take 1 tablet (600 mg total) by mouth every 6 (six) hours as needed for moderate pain or cramping. 40 tablet 1   . prenatal vitamin w/FE, FA (PRENATAL 1 + 1) 27-1 MG TABS tablet Take 1 tablet by mouth daily at 12 noon.       Review of Systems   Constitutional: Positive for fatigue.  Gastrointestinal: Positive for abdominal pain.  Genitourinary: Positive for vaginal bleeding.  Musculoskeletal: Positive for myalgias.  Skin: Negative for pallor.  Neurological: Positive for dizziness and light-headedness.  Psychiatric/Behavioral: The patient is nervous/anxious.     Blood pressure 112/61, pulse 78, temperature 97.9 F (36.6 C), temperature source Oral, resp. rate 18, SpO2 100 %, unknown if currently breastfeeding. Physical Exam  Constitutional: She is oriented to person, place, and time. She appears well-developed.  Cardiovascular: Normal rate.  Respiratory: Effort normal.  GI: Soft.  Genitourinary:    Uterus normal.   Musculoskeletal:        General: Normal range of motion.     Cervical back: Normal range of motion.  Neurological: She is alert and oriented to person, place, and time.  Skin: Skin is warm.  Psychiatric: She has a normal mood and affect. Her behavior is normal. Judgment and thought content normal.    Results for orders placed or performed during the hospital encounter of 12/30/18 (from the past 24 hour(s))  Type and screen     Status: None (Preliminary result)   Collection Time: 12/30/18 11:50 AM  Result Value Ref Range   ABO/RH(D) O POS    Antibody Screen NEG    Sample Expiration      01/02/2019,2359 Performed at [facility], [address] [place], [place] [address]    Unit Number [id number]    Blood Component Type RED  CELLS,LR    Unit division 00    Status of Unit ALLOCATED    Transfusion Status OK TO TRANSFUSE    Crossmatch Result Compatible    Unit Number [id number]    Blood Component Type RED CELLS,LR    Unit division 00    Status of Unit ALLOCATED    Transfusion Status OK TO TRANSFUSE    Crossmatch Result Compatible    Unit Number [id number]    Blood Component Type RED CELLS,LR    Unit division 00    Status of Unit ALLOCATED    Transfusion Status OK TO TRANSFUSE     Crossmatch Result Compatible    Unit Number [id number]    Blood Component Type RED CELLS,LR    Unit division 00    Status of Unit ALLOCATED    Transfusion Status OK TO TRANSFUSE    Crossmatch Result Compatible   Comprehensive metabolic panel     Status: Abnormal   Collection Time: 12/30/18 11:50 AM  Result Value Ref Range   Sodium 138 135 - 145 mmol/L   Potassium 4.7 3.5 - 5.1 mmol/L   Chloride 107 98 - 111 mmol/L   CO2 21 (L) 22 - 32 mmol/L   Glucose, Bld 109 (H) 70 - 99 mg/dL   BUN 15 6 - 20 mg/dL   Creatinine, Ser 5.280.74 0.44 - 1.00 mg/dL   Calcium 9.0 8.9 - 41.310.3 mg/dL   Total Protein 6.2 (L) 6.5 - 8.1 g/dL   Albumin 2.8 (L) 3.5 - 5.0 g/dL   AST 29 15 - 41 U/L   ALT 25 0 - 44 U/L   Alkaline Phosphatase 144 (H) 38 - 126 U/L   Total Bilirubin 0.7 0.3 - 1.2 mg/dL   GFR calc non Af Amer >60 >60 mL/min   GFR calc Af Amer >60 >60 mL/min   Anion gap 10 5 - 15  CBC     Status: Abnormal   Collection Time: 12/30/18 11:50 AM  Result Value Ref Range   WBC 17.9 (H) 4.0 - 10.5 K/uL   RBC 4.66 3.87 - 5.11 MIL/uL   Hemoglobin 11.2 (L) 12.0 - 15.0 g/dL   HCT 24.437.6 01.036.0 - 27.246.0 %   MCV 80.7 80.0 - 100.0 fL   MCH 24.0 (L) 26.0 - 34.0 pg   MCHC 29.8 (L) 30.0 - 36.0 g/dL   RDW 53.616.0 (H) 64.411.5 - 03.415.5 %   Platelets 540 (H) 150 - 400 K/uL   nRBC 0.0 0.0 - 0.2 %  Prepare RBC     Status: None   Collection Time: 12/30/18 11:50 AM  Result Value Ref Range   Order Confirmation      ORDER PROCESSED BY BLOOD BANK Performed at [facility], [address] [place], [place] [address]   CBC with Differential/Platelet     Status: Abnormal   Collection Time: 12/30/18 11:50 AM  Result Value Ref Range   WBC 17.7 (H) 4.0 - 10.5 K/uL   RBC 4.65 3.87 - 5.11 MIL/uL   Hemoglobin 11.3 (L) 12.0 - 15.0 g/dL   HCT 56.337.2 87.536.0 - 64.346.0 %   MCV 80.0 80.0 - 100.0 fL   MCH 24.3 (L) 26.0 - 34.0 pg   MCHC 30.4 30.0 - 36.0 g/dL   RDW 32.916.1 (H) 51.811.5 - 84.115.5 %   Platelets 511 (H) 150 - 400 K/uL   nRBC 0.0 0.0 -  0.2 %   Neutrophils Relative % 68 %   Neutro Abs 12.1 (H)  1.7 - 7.7 K/uL   Lymphocytes Relative 20 %   Lymphs Abs 3.5 0.7 - 4.0 K/uL   Monocytes Relative 7 %   Monocytes Absolute 1.2 (H) 0.1 - 1.0 K/uL   Eosinophils Relative 3 %   Eosinophils Absolute 0.6 (H) 0.0 - 0.5 K/uL   Basophils Relative 1 %   Basophils Absolute 0.1 0.0 - 0.1 K/uL   Immature Granulocytes 1 %   Abs Immature Granulocytes 0.24 (H) 0.00 - 0.07 K/uL    No results found.  Assessment/Plan: 49YD [id number] on PPD#7 s/p svd with shoulder dystocia now with bleeding suspected due to retained products of conception.  - Given bleeding has slowed down will await covid result  - Pt counseled and advised for need for suction dilation and evacuation of uterus. Plan to perform under ultrasound guidance - pt consents to procedure - strict NPO - 2units prbcs have been crossed and held - Risks and benefits of procedure discussed in full  Clarks 12/30/2018, 1:18 PM

## 2018-12-30 NOTE — MAU Provider Note (Signed)
History     CSN: [id number]  Arrival date and time: 12/30/18 1130   None     Chief Complaint  Patient presents with  . Vaginal Bleeding   HPI [name] is a 23 y.o. [phone] postpartum patient who presents to MAU from [facility] with chief complaint of heavy vaginal bleeding, tachycardia and hypotension. Patient states heavy bleeding started at 0430 today and has worsened since that time. S/p SVD over intact perineum on 12/23/18. Delivery complicated by 2 minute shoulder dystocia. EBL 162mL.  OB History    Gravida  3   Para  2   Term  2   Preterm      AB  1   Living  2     SAB  1   TAB      Ectopic      Multiple  0   Live Births  2           Past Medical History:  Diagnosis Date  . Anemia   . Anemia   . Anxiety   . Depression    takes meds  . Headache     Past Surgical History:  Procedure Laterality Date  . DILATION AND CURETTAGE OF UTERUS N/A 11/29/2015   Procedure: DILATATION AND CURETTAGE;  Surgeon: Malachy Mood, MD;  Location: [facility] ORS;  Service: Gynecology;  Laterality: N/A;    Family History  Problem Relation Age of Onset  . Hyperlipidemia Father   . Gout Father     Social History   Tobacco Use  . Smoking status: Never Smoker  . Smokeless tobacco: Never Used  Substance Use Topics  . Alcohol use: Yes    Alcohol/week: 0.0 standard drinks  . Drug use: No    Allergies: No Known Allergies  Medications Prior to Admission  Medication Sig Dispense Refill Last Dose  . calcium carbonate (TUMS - DOSED IN MG ELEMENTAL CALCIUM) 500 MG chewable tablet Chew 1-2 tablets by mouth as needed for indigestion or heartburn.     Marland Kitchen ibuprofen (ADVIL) 600 MG tablet Take 1 tablet (600 mg total) by mouth every 6 (six) hours as needed for moderate pain or cramping. 40 tablet 1   . prenatal vitamin w/FE, FA (PRENATAL 1 + 1) 27-1 MG TABS tablet Take 1 tablet by mouth daily at 12 noon.       Review of Systems  Constitutional: Positive for  fatigue. Negative for fever.  Respiratory: Negative for shortness of breath.   Gastrointestinal: Negative for abdominal pain.  Genitourinary: Positive for vaginal bleeding.  Musculoskeletal: Negative for back pain.  Neurological: Positive for dizziness, weakness and light-headedness. Negative for syncope.  All other systems reviewed and are negative.  Physical Exam   Blood pressure (!) 83/50, pulse 85, temperature 97.9 F (36.6 C), temperature source Oral, resp. rate 18, SpO2 100 %, unknown if currently breastfeeding.  Physical Exam  Nursing note and vitals reviewed. Constitutional: She is oriented to person, place, and time. She appears well-developed and well-nourished.  Cardiovascular: Normal rate.  Respiratory: Effort normal and breath sounds normal.  Neurological: She is alert and oriented to person, place, and time.  Skin: Skin is warm and dry. There is pallor.    MAU Course  Procedures  --Called to MAU registration by clerk. Patient alone in wheelchair. Noticably dusky and slow to respond to CNM questions. With RN staff x 2, triage bypassed, placed in MAU room 1S22. Patient unable to consistently respond to assessment questions and unable to consistently identify  day of the week. Actively passing silver dollar-sized clots as pants removed by staff. IV line and fluid bolus started, vitals cycled,  Fundus firm, 2 below U, multiple large clots passed when insertion of speculum --Dr Mindi Slicker calls at 1155 --Dr. Macon Large notified of patient arrival, concern for postpartum hemorrhage, orders placed per consult with Dr. Mindi Slicker --Dr. Mindi Slicker reached at 1218. Agrees with Dr. Mont Dutton assessment with concern for retained POC.  --Dr. Mindi Slicker called with CBC results, patient's positive response to interventions. Per Dr. Mindi Slicker, continue plan for transfer to Main OR  Orders Placed This Encounter  Procedures  . [place] MFM OB Comp Less 14 Wks    Bedside    Standing Status:   Standing    Number of  Occurrences:   1    Order Specific Question:   Symptom/Reason for Exam    Answer:   Postpartum state [[id number]]    Order Specific Question:   Symptom/Reason for Exam    Answer:   Postpartum hemorrhage [[id number]]  . Comprehensive metabolic panel    Standing Status:   Standing    Number of Occurrences:   1  . CBC    Standing Status:   Standing    Number of Occurrences:   1  . CBC with Differential/Platelet    Standing Status:   Standing    Number of Occurrences:   1  . Diet NPO time specified    Standing Status:   Standing    Number of Occurrences:   1  . Practitioner attestation of consent    I, the ordering practitioner, attest that I have discussed with the patient the benefits, risks, side effects, alternatives, likelihood of achieving goals and potential problems during recovery for the procedure listed.    Standing Status:   Standing    Number of Occurrences:   1    Order Specific Question:   Procedure    Answer:   Transfusion  . Complete patient signature process for consent form    Standing Status:   Standing    Number of Occurrences:   1  . Type and screen    Standing Status:   Standing    Number of Occurrences:   1  . Prepare RBC    Standing Status:   Standing    Number of Occurrences:   1    Order Specific Question:   # of Units    Answer:   2 units    Order Specific Question:   Transfusion Indications    Answer:   Actively Bleeding / GI Bleed    Order Specific Question:   Number of Units to Keep Ahead    Answer:   2 units ahead    Order Specific Question:   If emergent release call blood bank    Answer:   [facility] [phone]  . Insert peripheral IV    Standing Status:   Standing    Number of Occurrences:   1    Assessment and Plan  --23 y.o. [id number] postpartum patient --Retained POC on bedside ultrasound --Dr. Mindi Slicker inbound to OR  Calvert Cantor, CNM 12/30/2018, 1:19 PM

## 2018-12-30 NOTE — Transfer of Care (Signed)
Immediate Anesthesia Transfer of Care Note  Patient: [name]  Procedure(s) Performed: DILATATION AND EVACUATION (N/A ) Operative Ultrasound (N/A )  Patient Location: PACU  Anesthesia Type:MAC  Level of Consciousness: awake, oriented and patient cooperative  Airway & Oxygen Therapy: Patient Spontanous Breathing and Patient connected to nasal cannula oxygen  Post-op Assessment: Report given to RN and Post -op Vital signs reviewed and stable  Post vital signs: Reviewed and stable  Last Vitals:  Vitals Value Taken Time  BP 98/86 12/30/18 1516  Temp    Pulse 99 12/30/18 1523  Resp 21 12/30/18 1523  SpO2 100 % 12/30/18 1523  Vitals shown include unvalidated device data.  Last Pain:  Vitals:   12/30/18 1515  TempSrc:   PainSc: (P) 0-No pain         Complications: No apparent anesthesia complications

## 2018-12-30 NOTE — Anesthesia Procedure Notes (Signed)
Procedure Name: MAC Date/Time: 12/30/2018 2:08 PM Performed by: Renato Shin, CRNA Pre-anesthesia Checklist: Patient identified, Emergency Drugs available, Suction available and Patient being monitored Patient Re-evaluated:Patient Re-evaluated prior to induction Oxygen Delivery Method: Nasal cannula Preoxygenation: Pre-oxygenation with 100% oxygen Induction Type: IV induction Placement Confirmation: positive ETCO2 and breath sounds checked- equal and bilateral Dental Injury: Teeth and Oropharynx as per pre-operative assessment

## 2018-12-31 ENCOUNTER — Other Ambulatory Visit: Payer: Self-pay

## 2018-12-31 LAB — CBC
HCT: 21.4 % — ABNORMAL LOW (ref 36.0–46.0)
Hemoglobin: 6.7 g/dL — CL (ref 12.0–15.0)
MCH: 25.5 pg — ABNORMAL LOW (ref 26.0–34.0)
MCHC: 31.3 g/dL (ref 30.0–36.0)
MCV: 81.4 fL (ref 80.0–100.0)
Platelets: 251 10*3/uL (ref 150–400)
RBC: 2.63 MIL/uL — ABNORMAL LOW (ref 3.87–5.11)
RDW: 16 % — ABNORMAL HIGH (ref 11.5–15.5)
WBC: 10.2 10*3/uL (ref 4.0–10.5)
nRBC: 0 % (ref 0.0–0.2)

## 2018-12-31 LAB — PREPARE RBC (CROSSMATCH)

## 2018-12-31 MED ORDER — POLYSACCHARIDE IRON COMPLEX 150 MG PO CAPS: 150.0000 mg | ORAL_CAPSULE | Freq: Every day | ORAL | 3 refills | Status: AC

## 2018-12-31 MED ORDER — POLYSACCHARIDE IRON COMPLEX 150 MG PO CAPS
150.0000 mg | ORAL_CAPSULE | Freq: Every day | ORAL | Status: DC
  Administered 2018-12-31: 150 mg via ORAL
  Filled 2018-12-31: qty 1

## 2018-12-31 MED ORDER — SODIUM CHLORIDE 0.9% IV SOLUTION
Freq: Once | INTRAVENOUS | Status: AC
  Administered 2018-12-31: 08:00:00 via INTRAVENOUS

## 2018-12-31 NOTE — Progress Notes (Signed)
Discharge instructions reviewed with patient. Patient verbalized understanding.   Father of pt here to take her home. Pt walked out of unit with NT.

## 2018-12-31 NOTE — Anesthesia Postprocedure Evaluation (Signed)
Anesthesia Post Note  Patient: [name]  Procedure(s) Performed: DILATATION AND EVACUATION (N/A ) Operative Ultrasound (N/A )     Anesthesia Post Evaluation  Last Vitals:  Vitals:   12/30/18 2343 12/31/18 0411  BP: (!) 101/42 (!) 97/50  Pulse: 78 91  Resp: 18 18  Temp: 37.1 C 36.9 C  SpO2: 100%     Last Pain:  Vitals:   12/31/18 0411  TempSrc: Oral  PainSc:                  Latashia Koch DAVID

## 2018-12-31 NOTE — Progress Notes (Signed)
POD #1 D&C for retained POC Feels ok, PRBC transfusing Afeb, VSS Abd- soft Will d/c home after finishes this unit of blood

## 2018-12-31 NOTE — Discharge Instructions (Signed)
Call for increased bleeding, increasing pain, or fever

## 2018-12-31 NOTE — Discharge Summary (Signed)
Physician Discharge Summary  Patient ID: [name] MRN: [id number] DOB/AGE: 22-Apr-1995 23 y.o.  Admit date: 12/30/2018 Discharge date: 12/31/2018  Admission Diagnoses:  Retained POC  Discharge Diagnoses:  Same Active Problems:   Postpartum hemorrhage   Retained products of conception after delivery with complications   Discharged Condition: good  Hospital Course: Pt admitted for postpartum hemorrhage, u/s c/w retained POC, Dr. Terri Piedra did D&C for retained POC, EBL 1000cc.  Post-op Hgb 6.7, received one unit PRBC, but otherwise stable for discharge   Discharge Exam: Blood pressure (!) 108/52, pulse 69, temperature 98.2 F (36.8 C), temperature source Oral, resp. rate 16, SpO2 99 %, unknown if currently breastfeeding. General appearance: alert  Disposition: Discharge disposition: 01-Home or Self Care       Discharge Instructions    Call MD for:  difficulty breathing, headache or visual disturbances   Complete by: As directed    Call MD for:  extreme fatigue   Complete by: As directed    Call MD for:  persistant dizziness or light-headedness   Complete by: As directed    Call MD for:  persistant nausea and vomiting   Complete by: As directed    Call MD for:  severe uncontrolled pain   Complete by: As directed    Call MD for:  temperature >100.4   Complete by: As directed    Diet - low sodium heart healthy   Complete by: As directed    Increase activity slowly   Complete by: As directed      Allergies as of 12/31/2018   No Known Allergies     Medication List    TAKE these medications   calcium carbonate 500 MG chewable tablet Commonly known as: TUMS - dosed in mg elemental calcium Chew 1-2 tablets by mouth as needed for indigestion or heartburn.   ibuprofen 600 MG tablet Commonly known as: ADVIL Take 1 tablet (600 mg total) by mouth every 6 (six) hours as needed for moderate pain or cramping.   iron polysaccharides 150 MG capsule Commonly  known as: NIFEREX Take 1 capsule (150 mg total) by mouth daily.   prenatal vitamin w/FE, FA 27-1 MG Tabs tablet Take 1 tablet by mouth daily at 12 noon.        Signed: Blane Ohara [name] 12/31/2018, 8:33 AM

## 2018-12-31 NOTE — Progress Notes (Signed)
CRITICAL VALUE ALERT  Critical Value:  Hgb 6.7  Date & Time Notied:  12/30/08  Provider Notified: Dr Terri Piedra  Orders Received/Actions taken: Yes order to transfuse PRBC

## 2018-12-31 NOTE — Progress Notes (Signed)
Patient ID: [name], female   DOB: 02/06/1995, 23 y.o.   MRN: [id number] Late entry  Spoke to pt via phone. Pt reports slept well and feels well. Denied any dizziness or lightheadedness. Tolerated PO overnight.  BP :  97-101/42 - 50  Reviewed cbc result from this am. Given further drop in hemoglobin recommend transfusing at least another unit of blood. Pt agrees with plan  10.2>6.7<251  Called blood bank and pt nurse and gave order for 1 unit prbc transfusion

## 2019-01-01 LAB — TYPE AND SCREEN
ABO/RH(D): O POS
Antibody Screen: NEGATIVE
Unit division: 0
Unit division: 0
Unit division: 0
Unit division: 0
Unit division: 0
Unit division: 0

## 2019-01-01 LAB — BPAM RBC
Blood Product Expiration Date: 202101112359
Blood Product Expiration Date: 202101112359
Blood Product Expiration Date: 202101112359
Blood Product Expiration Date: 202101112359
Blood Product Expiration Date: 202101112359
Blood Product Expiration Date: 202101112359
ISSUE DATE / TIME: 202012101453
ISSUE DATE / TIME: 202012101453
ISSUE DATE / TIME: 202012101453
ISSUE DATE / TIME: 202012110727
Unit Type and Rh: 5100
Unit Type and Rh: 5100
Unit Type and Rh: 5100
Unit Type and Rh: 5100
Unit Type and Rh: 5100
Unit Type and Rh: 5100

## 2019-01-04 LAB — SURGICAL PATHOLOGY

## 2021-03-14 IMAGING — US US PELVIS LIMITED
1 series · 15 of 18 positions shown · non-contrast
Comparison: None.

CLINICAL DATA: 23-year-old postpartum female with hemorrhage.

EXAM:
LIMITED ULTRASOUND OF PELVIS
TECHNIQUE: Limited transabdominal ultrasound examination of the pelvis was
performed.

[Series 1: us pelvis limited · 15 of 18 slices shown]
[im 1/18]
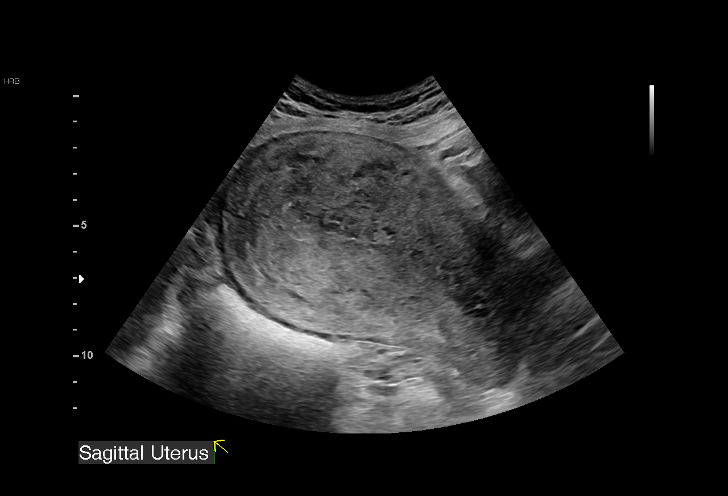
[im 2/18]
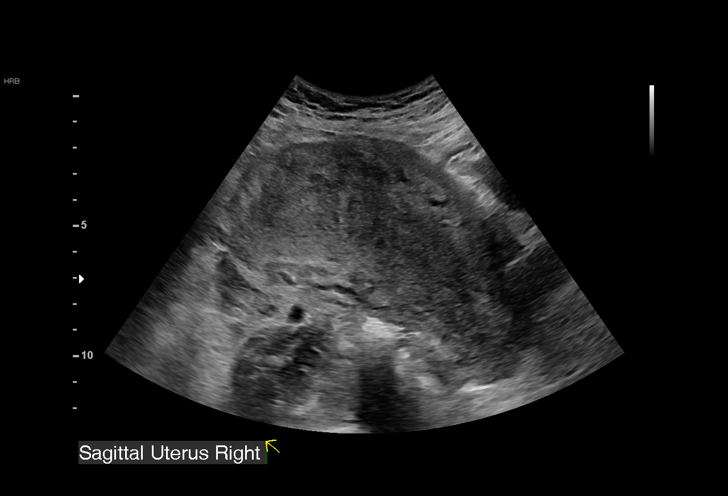
[im 4/18]
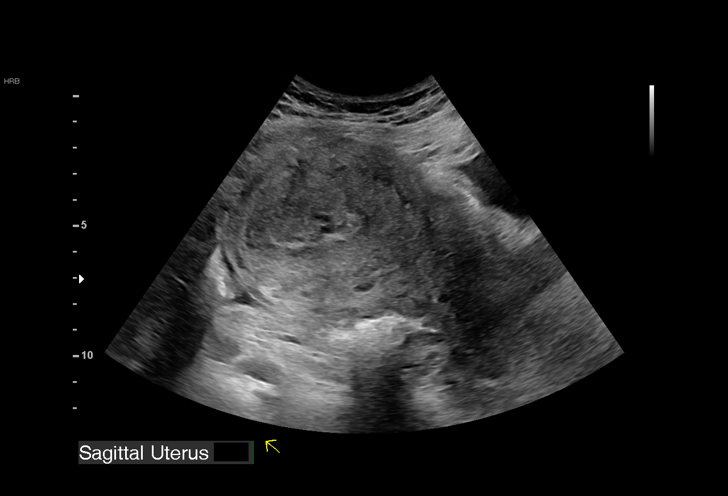
[im 5/18]
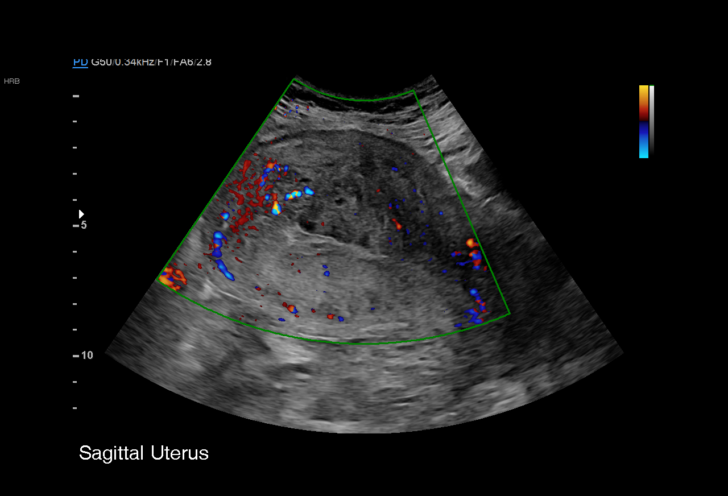
[im 6/18]
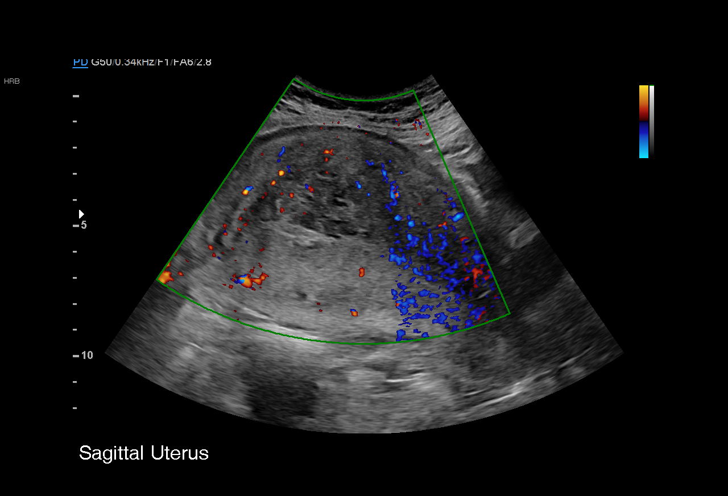
[im 7/18]
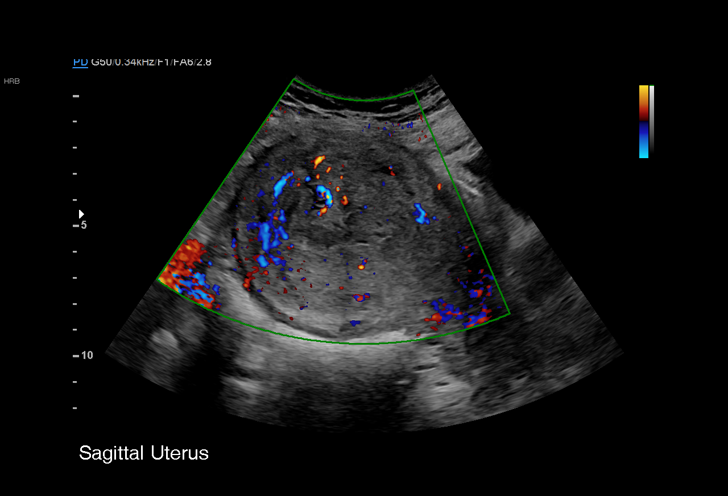
[im 8/18]
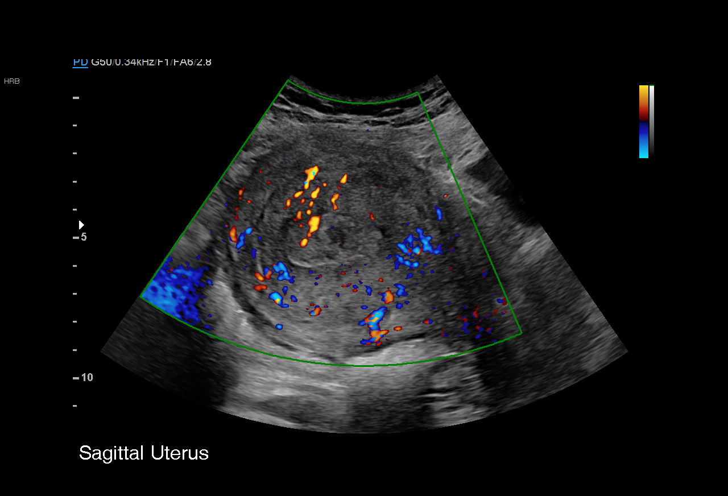
[im 10/18]
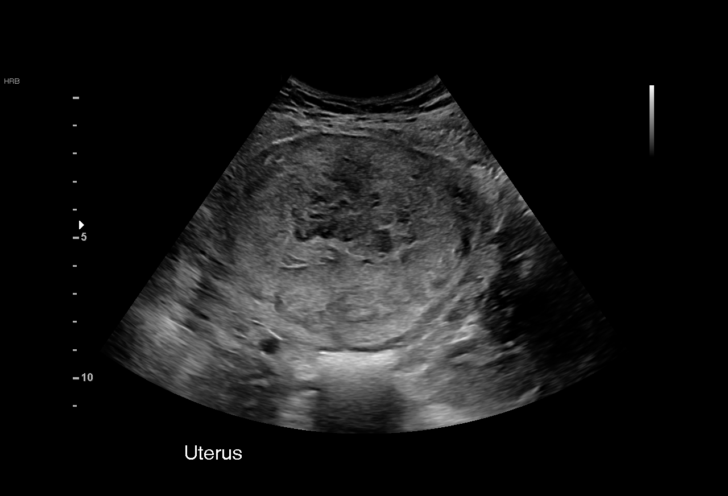
[im 11/18]
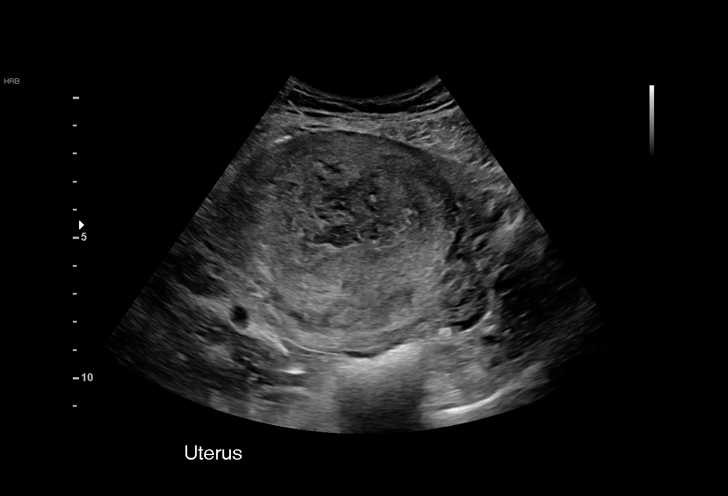
[im 12/18]
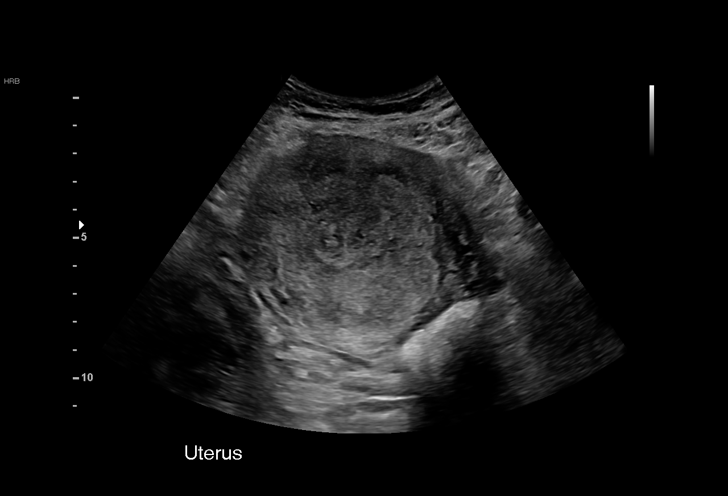
[im 13/18]
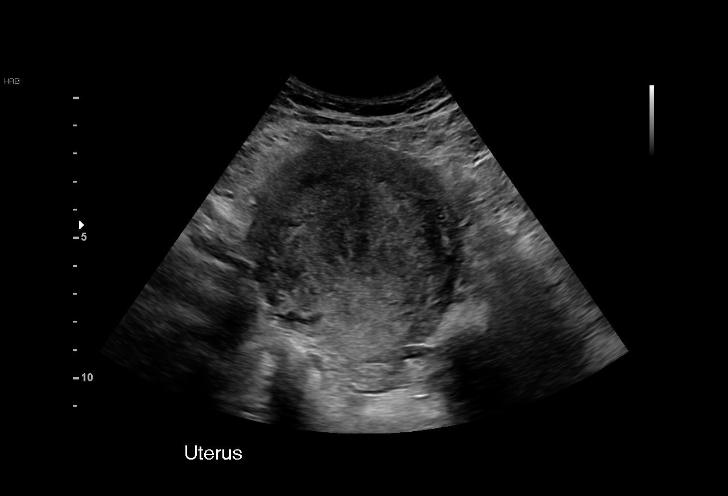
[im 14/18]
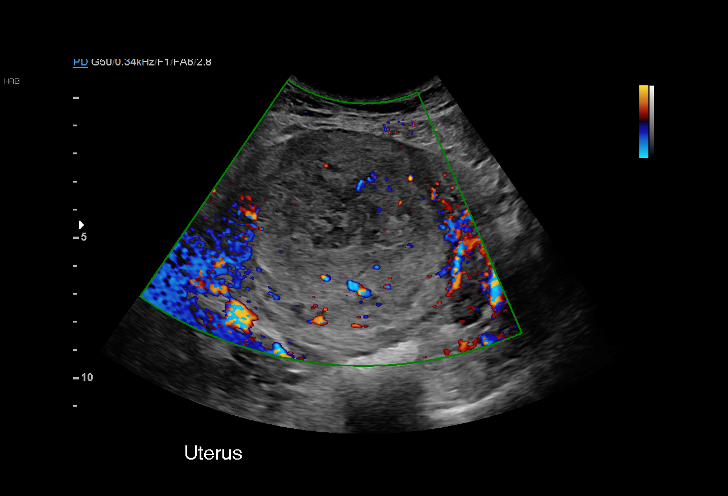
[im 16/18]
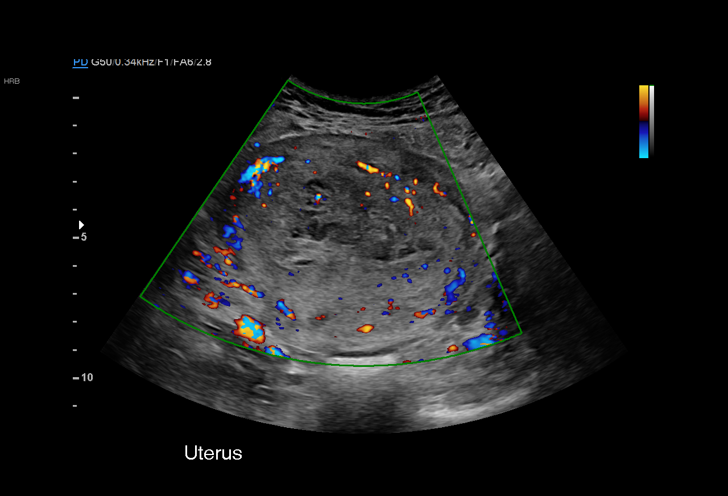
[im 17/18]
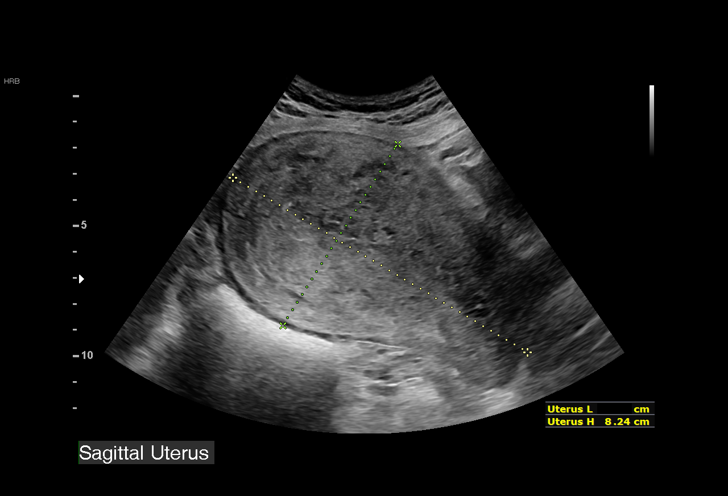
[im 18/18]
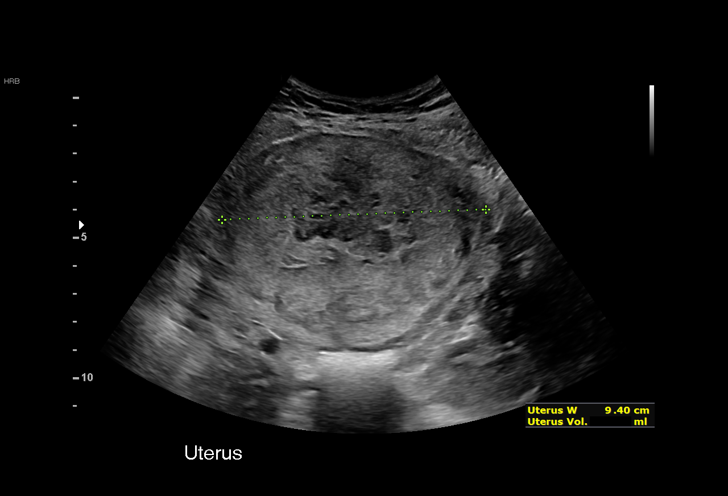

[15 of 18 positions shown; findings below may reference images not displayed]

FINDINGS: Enlarged anteverted postpartum uterus measuring 13.1 x 8.2 x 9.4 cm.
No uterine fibroids. Bilayer endometrial thickness approximately 18
mm. Heterogeneous endometrium. Scattered color Doppler flow noted in
the fundal cavity. No focal endometrial mass demonstrated. Adnexal
regions not imaged on this limited sonogram study. No abnormal free
fluid demonstrated in the pelvis.
IMPRESSION: Thickened (18 mm) heterogeneous endometrium with color Doppler flow
in the fundal cavity, compatible with retained products of
conception. No focal endometrial mass.
